# Patient Record
Sex: Female | Born: 1990 | Race: Black or African American | Hispanic: No | Marital: Single | Smoking: Current every day smoker
Health system: Southern US, Community
[De-identification: ages and names within clinical notes are randomized; demographics above are authoritative.]

## PROBLEM LIST (undated history)

## (undated) DIAGNOSIS — E282 Polycystic ovarian syndrome: Secondary | ICD-10-CM

## (undated) DIAGNOSIS — I10 Essential (primary) hypertension: Secondary | ICD-10-CM

## (undated) DIAGNOSIS — F319 Bipolar disorder, unspecified: Secondary | ICD-10-CM

## (undated) DIAGNOSIS — E119 Type 2 diabetes mellitus without complications: Secondary | ICD-10-CM

## (undated) HISTORY — DX: Bipolar disorder, unspecified: F31.9

## (undated) HISTORY — DX: Polycystic ovarian syndrome: E28.2

## (undated) HISTORY — PX: OOPHORECTOMY: SHX86

---
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
## 2009-08-23 ENCOUNTER — Emergency Department (HOSPITAL_COMMUNITY): Admission: EM | Admit: 2009-08-23 | Discharge: 2009-08-23 | Payer: Self-pay | Admitting: Emergency Medicine

## 2011-04-25 LAB — I-STAT 8, (EC8 V) (CONVERTED LAB)
BUN: 8
Chloride: 110
Glucose, Bld: 93
HCT: 46
Operator id: 288831
pCO2, Ven: 36.6 — ABNORMAL LOW
pH, Ven: 7.411 — ABNORMAL HIGH

## 2011-04-25 LAB — RAPID URINE DRUG SCREEN, HOSP PERFORMED
Amphetamines: NOT DETECTED
Barbiturates: NOT DETECTED
Benzodiazepines: NOT DETECTED
Cocaine: NOT DETECTED
Opiates: NOT DETECTED
Tetrahydrocannabinol: NOT DETECTED

## 2011-04-25 LAB — URINALYSIS, ROUTINE W REFLEX MICROSCOPIC
Bilirubin Urine: NEGATIVE
Glucose, UA: NEGATIVE
Hgb urine dipstick: NEGATIVE
Ketones, ur: NEGATIVE
Protein, ur: NEGATIVE
pH: 7

## 2011-04-25 LAB — ACETAMINOPHEN LEVEL: Acetaminophen (Tylenol), Serum: <10 — ABNORMAL LOW

## 2011-04-25 LAB — SALICYLATE LEVEL: Salicylate Lvl: <4

## 2011-04-25 LAB — ETHANOL: Alcohol, Ethyl (B): 176 — ABNORMAL HIGH

## 2012-09-21 ENCOUNTER — Other Ambulatory Visit (HOSPITAL_COMMUNITY)
Admission: RE | Admit: 2012-09-21 | Discharge: 2012-09-21 | Disposition: A | Discharge status: Home / Self Care | Payer: Self-pay | Source: Ambulatory Visit | Attending: Unknown Physician Specialty | Admitting: Unknown Physician Specialty

## 2012-09-21 DIAGNOSIS — R87619 Unspecified abnormal cytological findings in specimens from cervix uteri: Secondary | ICD-10-CM | POA: Insufficient documentation

## 2012-09-21 DIAGNOSIS — R87612 Low grade squamous intraepithelial lesion on cytologic smear of cervix (LGSIL): Secondary | ICD-10-CM | POA: Insufficient documentation

## 2013-07-28 ENCOUNTER — Encounter (HOSPITAL_COMMUNITY): Payer: Self-pay | Admitting: Emergency Medicine

## 2013-07-28 ENCOUNTER — Emergency Department (HOSPITAL_COMMUNITY)
Admission: EM | Admit: 2013-07-28 | Discharge: 2013-07-28 | Disposition: A | Discharge status: Home / Self Care | Payer: Self-pay | Attending: Emergency Medicine | Admitting: Emergency Medicine

## 2013-07-28 DIAGNOSIS — E119 Type 2 diabetes mellitus without complications: Secondary | ICD-10-CM | POA: Insufficient documentation

## 2013-07-28 DIAGNOSIS — Z79899 Other long term (current) drug therapy: Secondary | ICD-10-CM | POA: Insufficient documentation

## 2013-07-28 DIAGNOSIS — I10 Essential (primary) hypertension: Secondary | ICD-10-CM | POA: Insufficient documentation

## 2013-07-28 DIAGNOSIS — J029 Acute pharyngitis, unspecified: Secondary | ICD-10-CM | POA: Insufficient documentation

## 2013-07-28 DIAGNOSIS — Z87891 Personal history of nicotine dependence: Secondary | ICD-10-CM | POA: Insufficient documentation

## 2013-07-28 HISTORY — DX: Essential (primary) hypertension: I10

## 2013-07-28 HISTORY — DX: Type 2 diabetes mellitus without complications: E11.9

## 2013-07-28 LAB — GLUCOSE, CAPILLARY: GLUCOSE-CAPILLARY: 118 mg/dL — AB (ref 70–99)

## 2013-07-28 MED ORDER — HYDROCODONE-ACETAMINOPHEN 7.5-325 MG/15ML PO SOLN: 15.0000 mL | Freq: Three times a day (TID) | ORAL | Status: DC | PRN

## 2013-07-28 MED ORDER — LISINOPRIL 10 MG PO TABS
20.0000 mg | ORAL_TABLET | Freq: Once | ORAL | Status: AC
  Administered 2013-07-28: 20 mg via ORAL
  Filled 2013-07-28: qty 2

## 2013-07-28 MED ORDER — LISINOPRIL 20 MG PO TABS
20.0000 mg | ORAL_TABLET | Freq: Every day | ORAL | Status: DC
Start: 2013-07-28 — End: 2014-11-07

## 2013-07-28 NOTE — ED Notes (Signed)
Pt awakened with sore throat today, throat is red

## 2013-07-28 NOTE — ED Notes (Signed)
Vital signs stable. 

## 2013-07-28 NOTE — ED Provider Notes (Signed)
Medical screening examination/treatment/procedure(s) were performed by non-physician practitioner and as supervising physician I was immediately available for consultation/collaboration.  EKG Interpretation   None         Joya Gaskinsonald W Talin Feister, MD 07/28/13 1355

## 2013-07-28 NOTE — Discharge Instructions (Signed)
Recommend saltwater gargles and ibuprofen as needed for discomfort. You may use ice for severe sore throat. Resume taking your blood pressure medication as prescribed. Follow up with a primary care doctor. Return if symptoms worsen.  Viral Pharyngitis Viral pharyngitis is a viral infection that produces redness, pain, and swelling (inflammation) of the throat. It can spread from person to person (contagious). CAUSES Viral pharyngitis is caused by inhaling a large amount of certain germs called viruses. Many different viruses cause viral pharyngitis. SYMPTOMS Symptoms of viral pharyngitis include:  Sore throat.  Tiredness.  Stuffy nose.  Low-grade fever.  Congestion.  Cough. TREATMENT Treatment includes rest, drinking plenty of fluids, and the use of over-the-counter medication (approved by your caregiver). HOME CARE INSTRUCTIONS   Drink enough fluids to keep your urine clear or pale yellow.  Eat soft, cold foods such as ice cream, frozen ice pops, or gelatin dessert.  Gargle with warm salt water (1 tsp salt per 1 qt of water).  If over age 677, throat lozenges may be used safely.  Only take over-the-counter or prescription medicines for pain, discomfort, or fever as directed by your caregiver. Do not take aspirin. To help prevent spreading viral pharyngitis to others, avoid:  Mouth-to-mouth contact with others.  Sharing utensils for eating and drinking.  Coughing around others. SEEK MEDICAL CARE IF:   You are better in a few days, then become worse.  You have a fever or pain not helped by pain medicines.  There are any other changes that concern you. Document Released: 04/09/2005 Document Revised: 09/22/2011 Document Reviewed: 09/05/2010 [facility] Patient Information 2014 [place], [place]. Salt Water Gargle This solution will help make your mouth and throat feel better. HOME CARE INSTRUCTIONS   Mix 1 teaspoon of salt in 8 ounces of warm water.  Gargle with this  solution as much or often as you need or as directed. Swish and gargle gently if you have any sores or wounds in your mouth.  Do not swallow this mixture. Document Released: 04/03/2004 Document Revised: 09/22/2011 Document Reviewed: 08/25/2008 [facility] ChildrenExit[facility] Patient Information 2014 [place], [place].

## 2013-07-28 NOTE — ED Notes (Signed)
Sore throat today.

## 2013-07-28 NOTE — ED Provider Notes (Signed)
CSN: [id number]     Arrival date & time 07/28/13  1028 History   First MD Initiated Contact with Patient 07/28/13 1036     Chief Complaint  Patient presents with  . Sore Throat   (Consider location/radiation/quality/duration/timing/severity/associated sxs/prior Treatment) Patient is a [age] y.o. female presenting with pharyngitis. The history is provided by the patient. No language interpreter was used.  Sore Throat This is a new problem. The current episode started today. The problem occurs constantly. The problem has been unchanged. Associated symptoms include a sore throat. Pertinent negatives include no congestion, coughing, diaphoresis, fatigue, fever, myalgias, neck pain, rash, vomiting or weakness. The symptoms are aggravated by swallowing. She has tried nothing for the symptoms. Improvement on treatment: nothing tried.    Past Medical History  Diagnosis Date  . Hypertension   . Diabetes mellitus without complication    Past Surgical History  Procedure Laterality Date  . Oophorectomy     No family history on file. History  Substance Use Topics  . Smoking status: Former Games developermoker  . Smokeless tobacco: Not on file  . Alcohol Use: Yes   OB History   Grav Para Term Preterm Abortions TAB SAB Ect Mult Living                 Review of Systems  Constitutional: Negative for fever, diaphoresis and fatigue.  HENT: Positive for sore throat. Negative for congestion, drooling, sinus pressure and trouble swallowing.   Respiratory: Negative for cough.   Gastrointestinal: Negative for vomiting.  Musculoskeletal: Negative for myalgias and neck pain.  Skin: Negative for rash.  Neurological: Negative for weakness.  All other systems reviewed and are negative.    Allergies  Review of patient's allergies indicates no known allergies.  Home Medications   Current Outpatient Rx  Name  Route  Sig  Dispense  Refill  . ibuprofen (ADVIL,MOTRIN) 200 MG tablet   Oral   Take 800 mg by mouth  every 6 (six) hours as needed for fever or moderate pain.         . metFORMIN (GLUCOPHAGE) 500 MG tablet   Oral   Take 1,000 mg by mouth 2 (two) times daily with a meal.         . HYDROcodone-acetaminophen (HYCET) 7.5-325 mg/15 ml solution   Oral   Take 15 mLs by mouth every 8 (eight) hours as needed for moderate pain.   120 mL   0   . lisinopril (PRINIVIL,ZESTRIL) 20 MG tablet   Oral   Take 1 tablet (20 mg total) by mouth daily.   30 tablet   0    BP 156/107  Pulse 77  Temp(Src) 97.7 F (36.5 C)  Resp 16  Ht 5\' 7"  (1.702 m)  Wt 248 lb (112.492 kg)  BMI 38.83 kg/m2  SpO2 100%  LMP 07/03/2013 Physical Exam  Nursing note and vitals reviewed. Constitutional: She is oriented to person, place, and time. She appears well-developed and well-nourished. No distress.  HENT:  Head: Normocephalic and atraumatic.  Right Ear: Tympanic membrane, external ear and ear canal normal. No mastoid tenderness.  Left Ear: Tympanic membrane, external ear and ear canal normal. No mastoid tenderness.  Nose: Nose normal.  Mouth/Throat: Uvula is midline and mucous membranes are normal. Posterior oropharyngeal erythema present. No oropharyngeal exudate, posterior oropharyngeal edema or tonsillar abscesses.  Uvula midline and patient tolerating secretions without difficulty. Airway patent. No trismus or stridor. No exudates appreciated.  Eyes: Conjunctivae and EOM are normal. Pupils are  equal, round, and reactive to light. No scleral icterus.  Neck: Normal range of motion. Neck supple.  Cardiovascular: Normal rate, regular rhythm and normal heart sounds.   Pulmonary/Chest: Effort normal and breath sounds normal. No stridor. No respiratory distress. She has no wheezes. She has no rales.  Musculoskeletal: Normal range of motion.  Lymphadenopathy:    She has cervical adenopathy (Anterior cervical bilaterally; nontender).  Neurological: She is alert and oriented to person, place, and time.  Skin:  Skin is warm and dry. No rash noted. She is not diaphoretic. No erythema. No pallor.  Psychiatric: She has a normal mood and affect. Her behavior is normal.    ED Course  Procedures (including critical care time) Labs Review Labs Reviewed  GLUCOSE, CAPILLARY - Abnormal; Notable for the following:    Glucose-Capillary 118 (*)    All other components within normal limits   Imaging Review No results found.  EKG Interpretation   None       MDM   1. Pharyngitis    Uncomplicated pharyngitis. Patient is well and nontoxic appearing, hemodynamically stable, and afebrile. Hypertension improved over ED course and patient denies any headache or chest pain. Blood pressure medication administered. Uvula midline without evidence of peritonsillar abscess. No exudates appreciated; low likelihood of strep given physical exam, supported by Centor Score of 2. Airway patent and patient tolerating secretions without difficulty. No stridor. Patient is stable for discharge with prescription for Hycet for symptoms. PCP follow up advised.  Return precautions provided and patient agreeable to plan with no unaddressed concerns.    Antony Madura, PA-C 07/28/13 1132

## 2014-11-07 ENCOUNTER — Encounter (HOSPITAL_COMMUNITY): Payer: Self-pay | Admitting: Emergency Medicine

## 2014-11-07 ENCOUNTER — Emergency Department (HOSPITAL_COMMUNITY)
Admission: EM | Admit: 2014-11-07 | Discharge: 2014-11-07 | Disposition: A | Discharge status: Home / Self Care | Payer: Self-pay | Attending: Emergency Medicine | Admitting: Emergency Medicine

## 2014-11-07 ENCOUNTER — Emergency Department (HOSPITAL_COMMUNITY): Payer: Self-pay

## 2014-11-07 DIAGNOSIS — E119 Type 2 diabetes mellitus without complications: Secondary | ICD-10-CM | POA: Insufficient documentation

## 2014-11-07 DIAGNOSIS — I10 Essential (primary) hypertension: Secondary | ICD-10-CM | POA: Insufficient documentation

## 2014-11-07 DIAGNOSIS — J209 Acute bronchitis, unspecified: Secondary | ICD-10-CM

## 2014-11-07 DIAGNOSIS — R05 Cough: Secondary | ICD-10-CM

## 2014-11-07 DIAGNOSIS — J40 Bronchitis, not specified as acute or chronic: Secondary | ICD-10-CM | POA: Insufficient documentation

## 2014-11-07 DIAGNOSIS — Z79899 Other long term (current) drug therapy: Secondary | ICD-10-CM | POA: Insufficient documentation

## 2014-11-07 DIAGNOSIS — Z87891 Personal history of nicotine dependence: Secondary | ICD-10-CM | POA: Insufficient documentation

## 2014-11-07 DIAGNOSIS — R059 Cough, unspecified: Secondary | ICD-10-CM

## 2014-11-07 LAB — CBG MONITORING, ED: GLUCOSE-CAPILLARY: 325 mg/dL — AB (ref 70–99)

## 2014-11-07 MED ORDER — LISINOPRIL 10 MG PO TABS
20.0000 mg | ORAL_TABLET | Freq: Once | ORAL | Status: AC
  Administered 2014-11-07: 20 mg via ORAL
  Filled 2014-11-07: qty 2

## 2014-11-07 MED ORDER — LISINOPRIL 20 MG PO TABS: 20.0000 mg | ORAL_TABLET | Freq: Every day | ORAL | Status: DC

## 2014-11-07 MED ORDER — IPRATROPIUM-ALBUTEROL 0.5-2.5 (3) MG/3ML IN SOLN
3.0000 mL | Freq: Once | RESPIRATORY_TRACT | Status: AC
  Administered 2014-11-07: 3 mL via RESPIRATORY_TRACT
  Filled 2014-11-07: qty 3

## 2014-11-07 MED ORDER — HYDROCHLOROTHIAZIDE 12.5 MG PO TABS: 25.0000 mg | ORAL_TABLET | Freq: Every day | ORAL | Status: DC

## 2014-11-07 MED ORDER — HYDROCHLOROTHIAZIDE 25 MG PO TABS
25.0000 mg | ORAL_TABLET | Freq: Every day | ORAL | Status: DC
  Administered 2014-11-07: 25 mg via ORAL

## 2014-11-07 MED ORDER — ALBUTEROL SULFATE HFA 108 (90 BASE) MCG/ACT IN AERS
2.0000 | INHALATION_SPRAY | RESPIRATORY_TRACT | Status: DC | PRN
  Administered 2014-11-07: 2 via RESPIRATORY_TRACT
  Filled 2014-11-07: qty 6.7

## 2014-11-07 NOTE — Discharge Instructions (Signed)
Acute Bronchitis Bronchitis is inflammation of the airways that extend from the windpipe into the lungs (bronchi). The inflammation often causes mucus to develop. This leads to a cough, which is the most common symptom of bronchitis.  In acute bronchitis, the condition usually develops suddenly and goes away over time, usually in a couple weeks. Smoking, allergies, and asthma can make bronchitis worse. Repeated episodes of bronchitis may cause further lung problems.  CAUSES Acute bronchitis is most often caused by the same virus that causes a cold. The virus can spread from person to person (contagious) through coughing, sneezing, and touching contaminated objects. SIGNS AND SYMPTOMS   Cough.   Fever.   Coughing up mucus.   Body aches.   Chest congestion.   Chills.   Shortness of breath.   Sore throat.  DIAGNOSIS  Acute bronchitis is usually diagnosed through a physical exam. Your health care provider will also ask you questions about your medical history. Tests, such as chest X-rays, are sometimes done to rule out other conditions.  TREATMENT  Acute bronchitis usually goes away in a couple weeks. Oftentimes, no medical treatment is necessary. Medicines are sometimes given for relief of fever or cough. Antibiotic medicines are usually not needed but may be prescribed in certain situations. In some cases, an inhaler may be recommended to help reduce shortness of breath and control the cough. A cool mist vaporizer may also be used to help thin bronchial secretions and make it easier to clear the chest.  HOME CARE INSTRUCTIONS  Get plenty of rest.   Drink enough fluids to keep your urine clear or pale yellow (unless you have a medical condition that requires fluid restriction). Increasing fluids may help thin your respiratory secretions (sputum) and reduce chest congestion, and it will prevent dehydration.   Take medicines only as directed by your health care provider.  If  you were prescribed an antibiotic medicine, finish it all even if you start to feel better.  Avoid smoking and secondhand smoke. Exposure to cigarette smoke or irritating chemicals will make bronchitis worse. If you are a smoker, consider using nicotine gum or skin patches to help control withdrawal symptoms. Quitting smoking will help your lungs heal faster.   Reduce the chances of another bout of acute bronchitis by washing your hands frequently, avoiding people with cold symptoms, and trying not to touch your hands to your mouth, nose, or eyes.   Keep all follow-up visits as directed by your health care provider.  SEEK MEDICAL CARE IF: Your symptoms do not improve after 1 week of treatment.  SEEK IMMEDIATE MEDICAL CARE IF:  You develop an increased fever or chills.   You have chest pain.   You have severe shortness of breath.  You have bloody sputum.   You develop dehydration.  You faint or repeatedly feel like you are going to pass out.  You develop repeated vomiting.  You develop a severe headache. MAKE SURE YOU:   Understand these instructions.  Will watch your condition.  Will get help right away if you are not doing well or get worse. Document Released: 08/07/2004 Document Revised: 11/14/2013 Document Reviewed: 12/21/2012 [facility] Patient Information 2015 [place], [place]. This information is not intended to replace advice given to you by your health care provider. Make sure you discuss any questions you have with your health care provider.  Hypertension Hypertension, commonly called high blood pressure, is when the force of blood pumping through your arteries is too strong. Your arteries are  the blood vessels that carry blood from your heart throughout your body. A blood pressure reading consists of a higher number over a lower number, such as 110/72. The higher number (systolic) is the pressure inside your arteries when your heart pumps. The lower number  (diastolic) is the pressure inside your arteries when your heart relaxes. Ideally you want your blood pressure below 120/80. Hypertension forces your heart to work harder to pump blood. Your arteries may become narrow or stiff. Having hypertension puts you at risk for heart disease, stroke, and other problems.  RISK FACTORS Some risk factors for high blood pressure are controllable. Others are not.  Risk factors you cannot control include:   Race. You may be at higher risk if you are African American.  Age. Risk increases with age.  Gender. Men are at higher risk than women before age 19 years. After age 38, women are at higher risk than men. Risk factors you can control include:  Not getting enough exercise or physical activity.  Being overweight.  Getting too much fat, sugar, calories, or salt in your diet.  Drinking too much alcohol. SIGNS AND SYMPTOMS Hypertension does not usually cause signs or symptoms. Extremely high blood pressure (hypertensive crisis) may cause headache, anxiety, shortness of breath, and nosebleed. DIAGNOSIS  To check if you have hypertension, your health care provider will measure your blood pressure while you are seated, with your arm held at the level of your heart. It should be measured at least twice using the same arm. Certain conditions can cause a difference in blood pressure between your right and left arms. A blood pressure reading that is higher than normal on one occasion does not mean that you need treatment. If one blood pressure reading is high, ask your health care provider about having it checked again. TREATMENT  Treating high blood pressure includes making lifestyle changes and possibly taking medicine. Living a healthy lifestyle can help lower high blood pressure. You may need to change some of your habits. Lifestyle changes may include:  Following the DASH diet. This diet is high in fruits, vegetables, and whole grains. It is low in salt, red  meat, and added sugars.  Getting at least 2 hours of brisk physical activity every week.  Losing weight if necessary.  Not smoking.  Limiting alcoholic beverages.  Learning ways to reduce stress. If lifestyle changes are not enough to get your blood pressure under control, your health care provider may prescribe medicine. You may need to take more than one. Work closely with your health care provider to understand the risks and benefits. HOME CARE INSTRUCTIONS  Have your blood pressure rechecked as directed by your health care provider.   Take medicines only as directed by your health care provider. Follow the directions carefully. Blood pressure medicines must be taken as prescribed. The medicine does not work as well when you skip doses. Skipping doses also puts you at risk for problems.   Do not smoke.   Monitor your blood pressure at home as directed by your health care provider. SEEK MEDICAL CARE IF:   You think you are having a reaction to medicines taken.  You have recurrent headaches or feel dizzy.  You have swelling in your ankles.  You have trouble with your vision. SEEK IMMEDIATE MEDICAL CARE IF:  You develop a severe headache or confusion.  You have unusual weakness, numbness, or feel faint.  You have severe chest or abdominal pain.  You vomit repeatedly.  You have trouble breathing. MAKE SURE YOU:   Understand these instructions.  Will watch your condition.  Will get help right away if you are not doing well or get worse. Document Released: 06/30/2005 Document Revised: 11/14/2013 Document Reviewed: 04/22/2013 [facility] Patient Information 2015 [place], [place]. This information is not intended to replace advice given to you by your health care provider. Make sure you discuss any questions you have with your health care provider.  Albuterol inhalation aerosol What is this medicine? ALBUTEROL (al Gaspar Bidding) is a bronchodilator. It helps open up  the airways in your lungs to make it easier to breathe. This medicine is used to treat and to prevent bronchospasm. This medicine may be used for other purposes; ask your health care provider or pharmacist if you have questions. COMMON BRAND NAME(S): Proair HFA, Proventil, Proventil HFA, Respirol, Ventolin, Ventolin HFA What should I tell my health care provider before I take this medicine? They need to know if you have any of the following conditions: -diabetes -heart disease or irregular heartbeat -high blood pressure -pheochromocytoma -seizures -thyroid disease -an unusual or allergic reaction to albuterol, levalbuterol, sulfites, other medicines, foods, dyes, or preservatives -pregnant or trying to get pregnant -breast-feeding How should I use this medicine? This medicine is for inhalation through the mouth. Follow the directions on your prescription label. Take your medicine at regular intervals. Do not use more often than directed. Make sure that you are using your inhaler correctly. Ask you doctor or health care provider if you have any questions. Talk to your pediatrician regarding the use of this medicine in children. Special care may be needed. Overdosage: If you think you have taken too much of this medicine contact a poison control center or emergency room at once. NOTE: This medicine is only for you. Do not share this medicine with others. What if I miss a dose? If you miss a dose, use it as soon as you can. If it is almost time for your next dose, use only that dose. Do not use double or extra doses. What may interact with this medicine? -anti-infectives like chloroquine and pentamidine -caffeine -cisapride -diuretics -medicines for colds -medicines for depression or for emotional or psychotic conditions -medicines for weight loss including some herbal products -methadone -some antibiotics like clarithromycin, erythromycin, levofloxacin, and linezolid -some heart  medicines -steroid hormones like dexamethasone, cortisone, hydrocortisone -theophylline -thyroid hormones This list may not describe all possible interactions. Give your health care provider a list of all the medicines, herbs, non-prescription drugs, or dietary supplements you use. Also tell them if you smoke, drink alcohol, or use illegal drugs. Some items may interact with your medicine. What should I watch for while using this medicine? Tell your doctor or health care professional if your symptoms do not improve. Do not use extra albuterol. If your asthma or bronchitis gets worse while you are using this medicine, call your doctor right away. If your mouth gets dry try chewing sugarless gum or sucking hard candy. Drink water as directed. What side effects may I notice from receiving this medicine? Side effects that you should report to your doctor or health care professional as soon as possible: -allergic reactions like skin rash, itching or hives, swelling of the face, lips, or tongue -breathing problems -chest pain -feeling faint or lightheaded, falls -high blood pressure -irregular heartbeat -fever -muscle cramps or weakness -pain, tingling, numbness in the hands or feet -vomiting Side effects that usually do not require medical attention (report to  your doctor or health care professional if they continue or are bothersome): -cough -difficulty sleeping -headache -nervousness or trembling -stomach upset -stuffy or runny nose -throat irritation -unusual taste This list may not describe all possible side effects. Call your doctor for medical advice about side effects. You may report side effects to FDA at 1-800-FDA-1088. Where should I keep my medicine? Keep out of the reach of children. Store at room temperature between 15 and 30 degrees C (59 and 86 degrees F). The contents are under pressure and may burst when exposed to heat or flame. Do not freeze. This medicine does not work  as well if it is too cold. Throw away any unused medicine after the expiration date. Inhalers need to be thrown away after the labeled number of puffs have been used or by the expiration date; whichever comes first. Ventolin HFA should be thrown away 12 months after removing from foil pouch. Check the instructions that come with your medicine. NOTE: This sheet is a summary. It may not cover all possible information. If you have questions about this medicine, talk to your doctor, pharmacist, or health care provider.  2015, [employer] Standard. (2012-12-16 10:57:17)  Lisinopril tablets What is this medicine? LISINOPRIL (lyse IN oh pril) is an ACE inhibitor. This medicine is used to treat high blood pressure and heart failure. It is also used to protect the heart immediately after a heart attack. This medicine may be used for other purposes; ask your health care provider or pharmacist if you have questions. COMMON BRAND NAME(S): Prinivil, Zestril What should I tell my health care provider before I take this medicine? They need to know if you have any of these conditions: -diabetes -heart or blood vessel disease -immune system disease like lupus or scleroderma -kidney disease -low blood pressure -previous swelling of the tongue, face, or lips with difficulty breathing, difficulty swallowing, hoarseness, or tightening of the throat -an unusual or allergic reaction to lisinopril, other ACE inhibitors, insect venom, foods, dyes, or preservatives -pregnant or trying to get pregnant -breast-feeding How should I use this medicine? Take this medicine by mouth with a glass of water. Follow the directions on your prescription label. You may take this medicine with or without food. Take your medicine at regular intervals. Do not stop taking this medicine except on the advice of your doctor or health care professional. Talk to your pediatrician regarding the use of this medicine in children. Special care  may be needed. While this drug may be prescribed for children as young as 22 years of age for selected conditions, precautions do apply. Overdosage: If you think you have taken too much of this medicine contact a poison control center or emergency room at once. NOTE: This medicine is only for you. Do not share this medicine with others. What if I miss a dose? If you miss a dose, take it as soon as you can. If it is almost time for your next dose, take only that dose. Do not take double or extra doses. What may interact with this medicine? -diuretics -lithium -NSAIDs, medicines for pain and inflammation, like ibuprofen or naproxen -over-the-counter herbal supplements like hawthorn -potassium salts or potassium supplements -salt substitutes This list may not describe all possible interactions. Give your health care provider a list of all the medicines, herbs, non-prescription drugs, or dietary supplements you use. Also tell them if you smoke, drink alcohol, or use illegal drugs. Some items may interact with your medicine. What should I watch for  while using this medicine? Visit your doctor or health care professional for regular check ups. Check your blood pressure as directed. Ask your doctor what your blood pressure should be, and when you should contact him or her. Call your doctor or health care professional if you notice an irregular or fast heart beat. Women should inform their doctor if they wish to become pregnant or think they might be pregnant. There is a potential for serious side effects to an unborn child. Talk to your health care professional or pharmacist for more information. Check with your doctor or health care professional if you get an attack of severe diarrhea, nausea and vomiting, or if you sweat a lot. The loss of too much body fluid can make it dangerous for you to take this medicine. You may get drowsy or dizzy. Do not drive, use machinery, or do anything that needs mental  alertness until you know how this drug affects you. Do not stand or sit up quickly, especially if you are an older patient. This reduces the risk of dizzy or fainting spells. Alcohol can make you more drowsy and dizzy. Avoid alcoholic drinks. Avoid salt substitutes unless you are told otherwise by your doctor or health care professional. Do not treat yourself for coughs, colds, or pain while you are taking this medicine without asking your doctor or health care professional for advice. Some ingredients may increase your blood pressure. What side effects may I notice from receiving this medicine? Side effects that you should report to your doctor or health care professional as soon as possible: -abdominal pain with or without nausea or vomiting -allergic reactions like skin rash or hives, swelling of the hands, feet, face, lips, throat, or tongue -dark urine -difficulty breathing -dizzy, lightheaded or fainting spell -fever or sore throat -irregular heart beat, chest pain -pain or difficulty passing urine -redness, blistering, peeling or loosening of the skin, including inside the mouth -unusually weak -yellowing of the eyes or skin Side effects that usually do not require medical attention (report to your doctor or health care professional if they continue or are bothersome): -change in taste -cough -decreased sexual function or desire -headache -sun sensitivity -tiredness This list may not describe all possible side effects. Call your doctor for medical advice about side effects. You may report side effects to FDA at 1-800-FDA-1088. Where should I keep my medicine? Keep out of the reach of children. Store at room temperature between 15 and 30 degrees C (59 and 86 degrees F). Protect from moisture. Keep container tightly closed. Throw away any unused medicine after the expiration date. NOTE: This sheet is a summary. It may not cover all possible information. If you have questions about  this medicine, talk to your doctor, pharmacist, or health care provider.  2015, [employer] Standard. (2008-01-03 17:36:32)  Hydrochlorothiazide, HCTZ capsules or tablets What is this medicine? HYDROCHLOROTHIAZIDE (hye droe klor oh THYE a zide) is a diuretic. It increases the amount of urine passed, which causes the body to lose salt and water. This medicine is used to treat high blood pressure. It is also reduces the swelling and water retention caused by various medical conditions, such as heart, liver, or kidney disease. This medicine may be used for other purposes; ask your health care provider or pharmacist if you have questions. COMMON BRAND NAME(S): Esidrix, Ezide, HydroDIURIL, Microzide, Oretic, Zide What should I tell my health care provider before I take this medicine? They need to know if you have any of these  conditions: -diabetes -gout -immune system problems, like lupus -kidney disease or kidney stones -liver disease -pancreatitis -small amount of urine or difficulty passing urine -an unusual or allergic reaction to hydrochlorothiazide, sulfa drugs, other medicines, foods, dyes, or preservatives -pregnant or trying to get pregnant -breast-feeding How should I use this medicine? Take this medicine by mouth with a glass of water. Follow the directions on the prescription label. Take your medicine at regular intervals. Remember that you will need to pass urine frequently after taking this medicine. Do not take your doses at a time of day that will cause you problems. Do not stop taking your medicine unless your doctor tells you to. Talk to your pediatrician regarding the use of this medicine in children. Special care may be needed. Overdosage: If you think you have taken too much of this medicine contact a poison control center or emergency room at once. NOTE: This medicine is only for you. Do not share this medicine with others. What if I miss a dose? If you miss a dose, take  it as soon as you can. If it is almost time for your next dose, take only that dose. Do not take double or extra doses. What may interact with this medicine? -cholestyramine -colestipol -digoxin -dofetilide -lithium -medicines for blood pressure -medicines for diabetes -medicines that relax muscles for surgery -other diuretics -steroid medicines like prednisone or cortisone This list may not describe all possible interactions. Give your health care provider a list of all the medicines, herbs, non-prescription drugs, or dietary supplements you use. Also tell them if you smoke, drink alcohol, or use illegal drugs. Some items may interact with your medicine. What should I watch for while using this medicine? Visit your doctor or health care professional for regular checks on your progress. Check your blood pressure as directed. Ask your doctor or health care professional what your blood pressure should be and when you should contact him or her. You may need to be on a special diet while taking this medicine. Ask your doctor. Check with your doctor or health care professional if you get an attack of severe diarrhea, nausea and vomiting, or if you sweat a lot. The loss of too much body fluid can make it dangerous for you to take this medicine. You may get drowsy or dizzy. Do not drive, use machinery, or do anything that needs mental alertness until you know how this medicine affects you. Do not stand or sit up quickly, especially if you are an older patient. This reduces the risk of dizzy or fainting spells. Alcohol may interfere with the effect of this medicine. Avoid alcoholic drinks. This medicine may affect your blood sugar level. If you have diabetes, check with your doctor or health care professional before changing the dose of your diabetic medicine. This medicine can make you more sensitive to the sun. Keep out of the sun. If you cannot avoid being in the sun, wear protective clothing and use  sunscreen. Do not use sun lamps or tanning beds/booths. What side effects may I notice from receiving this medicine? Side effects that you should report to your doctor or health care professional as soon as possible: -allergic reactions such as skin rash or itching, hives, swelling of the lips, mouth, tongue, or throat -changes in vision -chest pain -eye pain -fast or irregular heartbeat -feeling faint or lightheaded, falls -gout attack -muscle pain or cramps -pain or difficulty when passing urine -pain, tingling, numbness in the hands or feet -redness,  blistering, peeling or loosening of the skin, including inside the mouth -unusually weak or tired Side effects that usually do not require medical attention (report to your doctor or health care professional if they continue or are bothersome): -change in sex drive or performance -dry mouth -headache -stomach upset This list may not describe all possible side effects. Call your doctor for medical advice about side effects. You may report side effects to FDA at 1-800-FDA-1088. Where should I keep my medicine? Keep out of the reach of children. Store at room temperature between 15 and 30 degrees C (59 and 86 degrees F). Do not freeze. Protect from light and moisture. Keep container closed tightly. Throw away any unused medicine after the expiration date. NOTE: This sheet is a summary. It may not cover all possible information. If you have questions about this medicine, talk to your doctor, pharmacist, or health care provider.  2015, [employer] Standard. (2010-02-22 12:57:37)

## 2014-11-07 NOTE — ED Notes (Signed)
Pt started new job Friday and went in today and was having sob and wheezing.

## 2014-11-07 NOTE — ED Provider Notes (Signed)
CSN: [id number]     Arrival date & time 11/07/14  0101 History   First MD Initiated Contact with Patient 11/07/14 0340     Chief Complaint  Patient presents with  . Shortness of Breath     (Consider location/radiation/quality/duration/timing/severity/associated sxs/prior Treatment) Patient is a 24 y.o. female presenting with shortness of breath. The history is provided by the patient.  Shortness of Breath She started new job about 3 days ago and noted onset morning of April 25 coughing and wheezing and dyspnea. Cough is productive of clear sputum with occasional streaks of blood she denies fever, chills, sweats or denies chest pain. There is no nausea. She denies arthralgias or myalgias. Of note, she does have history of diabetes and hypertension but has not been taking her antihypertensive medications. She states she knows when her blood pressure is high because she gets a headache. She had been on lisinopril 20 mg and HCTZ 25 mg. She states she has been taking her metformin for her diabetes but does not monitor her blood sugars.  Past Medical History  Diagnosis Date  . Hypertension   . Diabetes mellitus without complication    Past Surgical History  Procedure Laterality Date  . Oophorectomy     History reviewed. No pertinent family history. History  Substance Use Topics  . Smoking status: Former Games developermoker  . Smokeless tobacco: Not on file  . Alcohol Use: Yes   OB History    No data available     Review of Systems  Respiratory: Positive for shortness of breath.   All other systems reviewed and are negative.     Allergies  Review of patient's allergies indicates no known allergies.  Home Medications   Prior to Admission medications   Medication Sig Start Date End Date Taking? Authorizing Provider  HYDROcodone-acetaminophen (HYCET) 7.5-325 mg/15 ml solution Take 15 mLs by mouth every 8 (eight) hours as needed for moderate pain. 07/28/13   Antony MaduraKelly Humes, PA-C  ibuprofen  (ADVIL,MOTRIN) 200 MG tablet Take 800 mg by mouth every 6 (six) hours as needed for fever or moderate pain.    Historical Provider, MD  lisinopril (PRINIVIL,ZESTRIL) 20 MG tablet Take 1 tablet (20 mg total) by mouth daily. 07/28/13   Antony MaduraKelly Humes, PA-C  metFORMIN (GLUCOPHAGE) 500 MG tablet Take 1,000 mg by mouth 2 (two) times daily with a meal.    Historical Provider, MD   BP 163/117 mmHg  Pulse 110  Temp(Src) 98.5 F (36.9 C)  Resp 18  Ht 5\' 6"  (1.676 m)  Wt 240 lb (108.863 kg)  BMI 38.76 kg/m2  SpO2 100%  LMP 09/12/2014 Physical Exam  Nursing note and vitals reviewed.  24 year old female, resting comfortably and in no acute distress. Vital signs are significant for hypertension and tachycardia. Oxygen saturation is 100%, which is normal. Head is normocephalic and atraumatic. PERRLA, EOMI. Oropharynx is clear. Neck is nontender and supple without adenopathy or JVD. Back is nontender and there is no CVA tenderness. Lungshave markedly diminished air flow. However this does seem to be voluntary as patient states that she will cough if she takes a deep breath. I did not hear any rales, wheezes, or rhonchi. Chest is nontender. Heart has regular rate and rhythm without murmur. Abdomen is soft, flat, nontender without masses or hepatosplenomegaly and peristalsis is normoactive. Extremities have no cyanosis or edema, full range of motion is present. Skin is warm and dry without rash. Neurologic: Mental status is normal, cranial nerves are intact, there  are no motor or sensory deficits.  ED Course  Procedures (including critical care time) Labs Review Results for orders placed or performed during the hospital encounter of 11/07/14  POC CBG, ED  Result Value Ref Range   Glucose-Capillary 325 (H) 70 - 99 mg/dL   Imaging Review Dg Chest 2 View  11/07/2014   CLINICAL DATA:  Shortness of breath, cough and wheezing for 1 day.  EXAM: CHEST  2 VIEW  COMPARISON:  None.  FINDINGS: The  cardiomediastinal silhouette is unremarkable.  Mild peribronchial thickening identified.  There is no evidence of focal airspace disease, pulmonary edema, suspicious pulmonary nodule/mass, pleural effusion, or pneumothorax. No acute bony abnormalities are identified.  IMPRESSION: Mild peribronchial thickening without focal pneumonia. This is nonspecific and of uncertain chronicity but may be related to bronchitis or asthma.   Electronically Signed   By: Harmon Pier M.D.   On: 11/07/2014 04:38     MDM   Final diagnoses:  Cough  Acute bronchitis, unspecified organism  Essential hypertension  Type 2 diabetes mellitus without complication    Cough and wheezing. Chest x-ray will be obtained to rule out pneumonia. This could be related to environmental factors where she works. She will be given a therapeutic trial of a beautiful with ipratropium. Severe hypertension secondary to medication noncompliance. Old records are reviewed and blood pressure had been elevated at her last ED visit and she had been given a prescription for lisinopril at that time. She will be restarted on her lisinopril and hydrochlorothiazide. CBG will be checked.  Blood glucose is over 300, side do not feel she should get any steroids. She had significant improvement with albuterol with ipratropium and more improvement with a second treatment. Pressure has come down significantly with getting a dose of HCTZ and lisinopril. She is discharged with prescriptions for hydrochlorothiazide and lisinopril and she is given no beer on inhaler to take home. Follow-up with [facility] health Department.  Dione Booze, MD 11/07/14 [phone]

## 2015-05-11 ENCOUNTER — Encounter (HOSPITAL_COMMUNITY): Payer: Self-pay | Admitting: *Deleted

## 2015-05-11 ENCOUNTER — Emergency Department (HOSPITAL_COMMUNITY): Payer: Self-pay

## 2015-05-11 ENCOUNTER — Emergency Department (HOSPITAL_COMMUNITY)
Admission: EM | Admit: 2015-05-11 | Discharge: 2015-05-11 | Disposition: A | Discharge status: Home / Self Care | Payer: Self-pay | Attending: Emergency Medicine | Admitting: Emergency Medicine

## 2015-05-11 DIAGNOSIS — J40 Bronchitis, not specified as acute or chronic: Secondary | ICD-10-CM

## 2015-05-11 DIAGNOSIS — Z720 Tobacco use: Secondary | ICD-10-CM | POA: Insufficient documentation

## 2015-05-11 DIAGNOSIS — I10 Essential (primary) hypertension: Secondary | ICD-10-CM | POA: Insufficient documentation

## 2015-05-11 DIAGNOSIS — J4531 Mild persistent asthma with (acute) exacerbation: Secondary | ICD-10-CM | POA: Insufficient documentation

## 2015-05-11 DIAGNOSIS — Z79899 Other long term (current) drug therapy: Secondary | ICD-10-CM | POA: Insufficient documentation

## 2015-05-11 DIAGNOSIS — E119 Type 2 diabetes mellitus without complications: Secondary | ICD-10-CM | POA: Insufficient documentation

## 2015-05-11 LAB — CBC WITH DIFFERENTIAL/PLATELET
BASOS ABS: 0 10*3/uL (ref 0.0–0.1)
Basophils Relative: 0 %
EOS PCT: 2 %
Eosinophils Absolute: 0.5 10*3/uL (ref 0.0–0.7)
HCT: 36.4 % (ref 36.0–46.0)
Hemoglobin: 11.9 g/dL — ABNORMAL LOW (ref 12.0–15.0)
LYMPHS PCT: 6 %
Lymphs Abs: 1.2 10*3/uL (ref 0.7–4.0)
MCH: 26 pg (ref 26.0–34.0)
MCHC: 32.7 g/dL (ref 30.0–36.0)
MCV: 79.6 fL (ref 78.0–100.0)
MONO ABS: 0.7 10*3/uL (ref 0.1–1.0)
MONOS PCT: 3 %
Neutro Abs: 17.2 10*3/uL — ABNORMAL HIGH (ref 1.7–7.7)
Neutrophils Relative %: 89 %
PLATELETS: 388 10*3/uL (ref 150–400)
RBC: 4.57 MIL/uL (ref 3.87–5.11)
RDW: 15.6 % — AB (ref 11.5–15.5)
WBC: 19.5 10*3/uL — ABNORMAL HIGH (ref 4.0–10.5)

## 2015-05-11 LAB — URINALYSIS, ROUTINE W REFLEX MICROSCOPIC
Bilirubin Urine: NEGATIVE
Glucose, UA: NEGATIVE mg/dL
Hgb urine dipstick: NEGATIVE
KETONES UR: NEGATIVE mg/dL
LEUKOCYTES UA: NEGATIVE
Nitrite: NEGATIVE
PROTEIN: 100 mg/dL — AB
Specific Gravity, Urine: 1.02 (ref 1.005–1.030)
UROBILINOGEN UA: 0.2 mg/dL (ref 0.0–1.0)
pH: 6.5 (ref 5.0–8.0)

## 2015-05-11 LAB — COMPREHENSIVE METABOLIC PANEL
ALT: 13 U/L — ABNORMAL LOW (ref 14–54)
ANION GAP: 9 (ref 5–15)
AST: 16 U/L (ref 15–41)
Albumin: 3.6 g/dL (ref 3.5–5.0)
Alkaline Phosphatase: 80 U/L (ref 38–126)
BILIRUBIN TOTAL: 0.7 mg/dL (ref 0.3–1.2)
BUN: 9 mg/dL (ref 6–20)
CHLORIDE: 101 mmol/L (ref 101–111)
CO2: 26 mmol/L (ref 22–32)
Calcium: 9 mg/dL (ref 8.9–10.3)
Creatinine, Ser: 0.64 mg/dL (ref 0.44–1.00)
Glucose, Bld: 116 mg/dL — ABNORMAL HIGH (ref 65–99)
POTASSIUM: 3.9 mmol/L (ref 3.5–5.1)
Sodium: 136 mmol/L (ref 135–145)
TOTAL PROTEIN: 8 g/dL (ref 6.5–8.1)

## 2015-05-11 LAB — URINE MICROSCOPIC-ADD ON

## 2015-05-11 MED ORDER — ONDANSETRON 4 MG PO TBDP
4.0000 mg | ORAL_TABLET | Freq: Once | ORAL | Status: AC
  Administered 2015-05-11: 4 mg via ORAL
  Filled 2015-05-11: qty 1

## 2015-05-11 MED ORDER — AZITHROMYCIN 250 MG PO TABS: 250.0000 mg | ORAL_TABLET | Freq: Every day | ORAL | Status: DC

## 2015-05-11 MED ORDER — ALBUTEROL SULFATE HFA 108 (90 BASE) MCG/ACT IN AERS: 1.0000 | INHALATION_SPRAY | Freq: Four times a day (QID) | RESPIRATORY_TRACT | Status: DC | PRN

## 2015-05-11 MED ORDER — IPRATROPIUM-ALBUTEROL 0.5-2.5 (3) MG/3ML IN SOLN
3.0000 mL | Freq: Once | RESPIRATORY_TRACT | Status: AC
  Administered 2015-05-11: 3 mL via RESPIRATORY_TRACT
  Filled 2015-05-11: qty 3

## 2015-05-11 NOTE — ED Notes (Signed)
Pt states she started feeling bad Thursday. Symptoms of coughing, sneezing, nasal congestion, wheezing, and vomiting. Last vomited this morning.

## 2015-05-11 NOTE — ED Provider Notes (Signed)
CSN: [id number]     Arrival date & time 05/11/15  0805 History   First MD Initiated Contact with Patient 05/11/15 [phone]     Chief Complaint  Patient presents with  . Cough     (Consider location/radiation/quality/duration/timing/severity/associated sxs/prior Treatment) Patient is a 24 y.o. female presenting with cough. The history is provided by the patient. No language interpreter was used.  Cough Cough characteristics:  Productive Sputum characteristics:  Nondescript Severity:  Moderate Onset quality:  Gradual Duration:  2 days Timing:  Constant Progression:  Worsening Chronicity:  New Smoker: no   Context: not sick contacts   Relieved by:  Nothing Worsened by:  Nothing tried Ineffective treatments:  None tried Associated symptoms: rhinorrhea, shortness of breath and sinus congestion     Past Medical History  Diagnosis Date  . Hypertension   . Diabetes mellitus without complication [facility])    Past Surgical History  Procedure Laterality Date  . Oophorectomy     No family history on file. Social History  Substance Use Topics  . Smoking status: Current Every Day Smoker    Types: Cigarettes  . Smokeless tobacco: None  . Alcohol Use: Yes   OB History    No data available     Review of Systems  HENT: Positive for rhinorrhea.   Respiratory: Positive for cough and shortness of breath.   All other systems reviewed and are negative.     Allergies  Cherry and Coconut flavor  Home Medications   Prior to Admission medications   Medication Sig Start Date End Date Taking? Authorizing Provider  ibuprofen (ADVIL,MOTRIN) 200 MG tablet Take 800 mg by mouth every 6 (six) hours as needed for fever or moderate pain.   Yes Historical Provider, MD  lisinopril-hydrochlorothiazide (PRINZIDE,ZESTORETIC) 20-12.5 MG tablet Take 1 tablet by mouth daily.   Yes Historical Provider, MD  metFORMIN (GLUCOPHAGE) 500 MG tablet Take 1,000 mg by mouth 2 (two) times daily with a meal.   Yes  Historical Provider, MD  hydrochlorothiazide (HYDRODIURIL) 12.5 MG tablet Take 2 tablets (25 mg total) by mouth daily. Patient not taking: Reported on 05/11/2015 11/07/14   Dione Booze, MD  lisinopril (PRINIVIL,ZESTRIL) 20 MG tablet Take 1 tablet (20 mg total) by mouth daily. Patient not taking: Reported on 05/11/2015 11/07/14   Dione Booze, MD   BP 162/111 mmHg  Pulse 114  Temp(Src) 98.2 F (36.8 C)  Resp 22  Ht  (1.702 m)  Wt 240 lb (108.863 kg)  BMI 37.58 kg/m2  SpO2 94%  LMP 04/20/2015 Physical Exam  Constitutional: She is oriented to person, place, and time. She appears well-developed and well-nourished.  HENT:  Head: Normocephalic and atraumatic.  Right Ear: External ear normal.  Left Ear: External ear normal.  Eyes: Conjunctivae and EOM are normal. Pupils are equal, round, and reactive to light.  Neck: Normal range of motion.  Cardiovascular: Normal rate and normal heart sounds.   Pulmonary/Chest: Effort normal.  Abdominal: Soft. She exhibits no distension.  Musculoskeletal: Normal range of motion.  Neurological: She is alert and oriented to person, place, and time.  Skin: Skin is warm.  Psychiatric: She has a normal mood and affect.  Nursing note and vitals reviewed.   ED Course  Procedures (including critical care time) Labs Review Labs Reviewed  CBC WITH DIFFERENTIAL/PLATELET - Abnormal; Notable for the following:    WBC 19.5 (*)    Hemoglobin 11.9 (*)    RDW 15.6 (*)    Neutro Abs 17.2 (*)  All other components within normal limits  COMPREHENSIVE METABOLIC PANEL - Abnormal; Notable for the following:    Glucose, Bld 116 (*)    ALT 13 (*)    All other components within normal limits    Imaging Review Dg Chest 2 View  05/11/2015  CLINICAL DATA:  Cough, shortness of breath, chest pain. EXAM: CHEST  2 VIEW COMPARISON:  November 07, 2014. FINDINGS: The heart size and mediastinal contours are within normal limits. Both lungs are clear. No pneumothorax or  pleural effusion is noted. The visualized skeletal structures are unremarkable. IMPRESSION: No active cardiopulmonary disease. Electronically Signed   By: Lupita RaiderJames  Green Jr, M.D.   On: 05/11/2015 09:31   I have personally reviewed and evaluated these images and lab results as part of my medical decision-making.   EKG Interpretation None      MDM  No pneumonia.   Pt did not take her blood pressure medication today.  Pt has been out of her inhaler.   Pt reports decreased nausea and is drinking after zofran odt   Final diagnoses:  Bronchitis  Asthma, mild persistent, with acute exacerbation  Essential hypertension    Albuterol zithromax Take your blood pressure medication See your Physicain for recheck in 2-3 days    Elson AreasLeslie K Sofia, PA-C 05/11/15 1132  Glynn OctaveStephen Rancour, MD 05/11/15 [phone]

## 2015-05-11 NOTE — Discharge Instructions (Signed)
Acute Bronchitis Bronchitis is inflammation of the airways that extend from the windpipe into the lungs (bronchi). The inflammation often causes mucus to develop. This leads to a cough, which is the most common symptom of bronchitis.  In acute bronchitis, the condition usually develops suddenly and goes away over time, usually in a couple weeks. Smoking, allergies, and asthma can make bronchitis worse. Repeated episodes of bronchitis may cause further lung problems.  CAUSES Acute bronchitis is most often caused by the same virus that causes a cold. The virus can spread from person to person (contagious) through coughing, sneezing, and touching contaminated objects. SIGNS AND SYMPTOMS   Cough.   Fever.   Coughing up mucus.   Body aches.   Chest congestion.   Chills.   Shortness of breath.   Sore throat.  DIAGNOSIS  Acute bronchitis is usually diagnosed through a physical exam. Your health care provider will also ask you questions about your medical history. Tests, such as chest X-rays, are sometimes done to rule out other conditions.  TREATMENT  Acute bronchitis usually goes away in a couple weeks. Oftentimes, no medical treatment is necessary. Medicines are sometimes given for relief of fever or cough. Antibiotic medicines are usually not needed but may be prescribed in certain situations. In some cases, an inhaler may be recommended to help reduce shortness of breath and control the cough. A cool mist vaporizer may also be used to help thin bronchial secretions and make it easier to clear the chest.  HOME CARE INSTRUCTIONS  Get plenty of rest.   Drink enough fluids to keep your urine clear or pale yellow (unless you have a medical condition that requires fluid restriction). Increasing fluids may help thin your respiratory secretions (sputum) and reduce chest congestion, and it will prevent dehydration.   Take medicines only as directed by your health care provider.  If  you were prescribed an antibiotic medicine, finish it all even if you start to feel better.  Avoid smoking and secondhand smoke. Exposure to cigarette smoke or irritating chemicals will make bronchitis worse. If you are a smoker, consider using nicotine gum or skin patches to help control withdrawal symptoms. Quitting smoking will help your lungs heal faster.   Reduce the chances of another bout of acute bronchitis by washing your hands frequently, avoiding people with cold symptoms, and trying not to touch your hands to your mouth, nose, or eyes.   Keep all follow-up visits as directed by your health care provider.  SEEK MEDICAL CARE IF: Your symptoms do not improve after 1 week of treatment.  SEEK IMMEDIATE MEDICAL CARE IF:  You develop an increased fever or chills.   You have chest pain.   You have severe shortness of breath.  You have bloody sputum.   You develop dehydration.  You faint or repeatedly feel like you are going to pass out.  You develop repeated vomiting.  You develop a severe headache. MAKE SURE YOU:   Understand these instructions.  Will watch your condition.  Will get help right away if you are not doing well or get worse.   This information is not intended to replace advice given to you by your health care provider. Make sure you discuss any questions you have with your health care provider.   Document Released: 08/07/2004 Document Revised: 07/21/2014 Document Reviewed: 12/21/2012 Elsevier Interactive Patient Education 2016 Elsevier Inc. Asthma Attack Prevention While you may not be able to control the fact that you have asthma, you can  can take actions to prevent asthma attacks. The best way to prevent asthma attacks is to maintain good control of your asthma. You can achieve this by: °· Taking your medicines as directed. °· Avoiding things that can irritate your airways or make your asthma symptoms worse (asthma triggers). °· Keeping track of how  well your asthma is controlled and of any changes in your symptoms. °· Responding quickly to worsening asthma symptoms (asthma attack). °· Seeking emergency care when it is needed. °WHAT ARE SOME WAYS TO PREVENT AN ASTHMA ATTACK? °Have a Plan °Work with your health care provider to create a written plan for managing and treating your asthma attacks (asthma action plan). This plan includes: °· A list of your asthma triggers and how you can avoid them. °· Information on when medicines should be taken and when their dosages should be changed. °· The use of a device that measures how well your lungs are working (peak flow meter). °Monitor Your Asthma °Use your peak flow meter and record your results in a journal every day. A drop in your peak flow numbers on one or more days may indicate the start of an asthma attack. This can happen even before you start to feel symptoms. You can prevent an asthma attack from getting worse by following the steps in your asthma action plan. °Avoid Asthma Triggers °Work with your asthma health care provider to find out what your asthma triggers are. This can be done by: °· Allergy testing. °· Keeping a journal that notes when asthma attacks occur and the factors that may have contributed to them. °· Determining if there are other medical conditions that are making your asthma worse. °Once you have determined your asthma triggers, take steps to avoid them. This may include avoiding excessive or prolonged exposure to: °· Dust. Have someone dust and vacuum your home for you once or twice a week. Using a high-efficiency particulate arrestance (HEPA) vacuum is best. °· Smoke. This includes campfire smoke, forest fire smoke, and secondhand smoke from tobacco products. °· Pet dander. Avoid contact with animals that you know you are allergic to. °· Allergens from trees, grasses or pollens. Avoid spending a lot of time outdoors when pollen counts are high, and on very windy days. °· Very cold,  dry, or humid air. °· Mold. °· Foods that contain high amounts of sulfites. °· Strong odors. °· Outdoor air pollutants, such as engine exhaust. °· Indoor air pollutants, such as aerosol sprays and fumes from household cleaners. °· Household pests, including dust mites and cockroaches, and pest droppings. °· Certain medicines, including NSAIDs. Always talk to your health care provider before stopping or starting any new medicines. °Medicines °Take over-the-counter and prescription medicines only as told by your health care provider. Many asthma attacks can be prevented by carefully following your medicine schedule. Taking your medicines correctly is especially important when you cannot avoid certain asthma triggers. °Act Quickly °If an asthma attack does happen, acting quickly can decrease how severe it is and how long it lasts. Take these steps:  °· Pay attention to your symptoms. If you are coughing, wheezing, or having difficulty breathing, do not wait to see if your symptoms go away on their own. Follow your asthma action plan. °· If you have followed your asthma action plan and your symptoms are not improving, call your health care provider or seek immediate medical care at the nearest hospital. °It is important to note how often you need to use your fast-acting   inhaler. If you are using your rescue inhaler more often, it may mean that your asthma is not under control. Adjusting your asthma treatment plan may help you to prevent future asthma attacks and help you to gain better control of your condition. HOW CAN I PREVENT AN ASTHMA ATTACK WHEN I EXERCISE? Follow advice from your health care provider about whether you should use your fast-acting inhaler before exercising. Many people with asthma experience exercise-induced bronchoconstriction (EIB). This condition often worsens during vigorous exercise in cold, humid, or dry environments. Usually, people with EIB can stay very active by pre-treating with a  fast-acting inhaler before exercising.   This information is not intended to replace advice given to you by your health care provider. Make sure you discuss any questions you have with your health care provider.   Document Released: 06/18/2009 Document Revised: 03/21/2015 Document Reviewed: 11/30/2014 Elsevier Interactive Patient Education [employer]2016 Elsevier Inc[employer]

## 2015-06-18 ENCOUNTER — Ambulatory Visit: Payer: Self-pay | Admitting: Pediatrics

## 2015-06-19 ENCOUNTER — Encounter: Payer: Self-pay | Admitting: Pediatrics

## 2015-08-01 ENCOUNTER — Ambulatory Visit: Payer: Self-pay | Admitting: Pediatrics

## 2015-08-02 ENCOUNTER — Encounter: Payer: Self-pay | Admitting: Pediatrics

## 2015-08-23 ENCOUNTER — Encounter: Payer: Self-pay | Admitting: Obstetrics & Gynecology

## 2015-08-30 ENCOUNTER — Telehealth (HOSPITAL_COMMUNITY): Payer: Self-pay | Admitting: *Deleted

## 2015-08-30 NOTE — Telephone Encounter (Signed)
Called pt and lmtcb due to needing to resch appt for Feb 20th, 2017 due to provider being out of office. 

## 2015-09-03 ENCOUNTER — Ambulatory Visit (HOSPITAL_COMMUNITY): Payer: Self-pay | Admitting: Psychiatry

## 2015-09-20 ENCOUNTER — Ambulatory Visit (INDEPENDENT_AMBULATORY_CARE_PROVIDER_SITE_OTHER): Payer: BLUE CROSS/BLUE SHIELD | Admitting: Adult Health

## 2015-09-20 ENCOUNTER — Encounter: Payer: Self-pay | Admitting: Adult Health

## 2015-09-20 VITALS — BP 160/100 | HR 92 | Ht 66.0 in | Wt 225.0 lb

## 2015-09-20 DIAGNOSIS — Z3009 Encounter for other general counseling and advice on contraception: Secondary | ICD-10-CM | POA: Diagnosis not present

## 2015-09-20 DIAGNOSIS — I10 Essential (primary) hypertension: Secondary | ICD-10-CM | POA: Diagnosis not present

## 2015-09-20 NOTE — Patient Instructions (Addendum)
Call with period  Will check insurance for para gard Intrauterine Device Information An intrauterine device (IUD) is inserted into your uterus to prevent pregnancy. There are two types of IUDs available:   Copper IUD--This type of IUD is wrapped in copper wire and is placed inside the uterus. Copper makes the uterus and fallopian tubes produce a fluid that kills sperm. The copper IUD can stay in place for 10 years.  Hormone IUD--This type of IUD contains the hormone progestin (synthetic progesterone). The hormone thickens the cervical mucus and prevents sperm from entering the uterus. It also thins the uterine lining to prevent implantation of a fertilized egg. The hormone can weaken or kill the sperm that get into the uterus. One type of hormone IUD can stay in place for 5 years, and another type can stay in place for 3 years. Your health care provider will make sure you are a good candidate for a contraceptive IUD. Discuss with your health care provider the possible side effects.  ADVANTAGES OF AN INTRAUTERINE DEVICE  IUDs are highly effective, reversible, long acting, and low maintenance.   There are no estrogen-related side effects.   An IUD can be used when breastfeeding.   IUDs are not associated with weight gain.   The copper IUD works immediately after insertion.   The hormone IUD works right away if inserted within 7 days of your period starting. You will need to use a backup method of birth control for 7 days if the hormone IUD is inserted at any other time in your cycle.  The copper IUD does not interfere with your female hormones.   The hormone IUD can make heavy menstrual periods lighter and decrease cramping.   The hormone IUD can be used for 3 or 5 years.   The copper IUD can be used for 10 years. DISADVANTAGES OF AN INTRAUTERINE DEVICE  The hormone IUD can be associated with irregular bleeding patterns.   The copper IUD can make your menstrual flow heavier  and more painful.   You may experience cramping and vaginal bleeding after insertion.    This information is not intended to replace advice given to you by your health care provider. Make sure you discuss any questions you have with your health care provider.   Document Released: 06/03/2004 Document Revised: 03/02/2013 Document Reviewed: 12/19/2012 Elsevier Interactive Patient Education [employer]2016 Elsevier Inc.

## 2015-09-20 NOTE — Progress Notes (Signed)
Subjective:     Patient ID: Patricia Ortiz, female   DOB: 07-27-1990, 25 y.o.   MRN: [id number]  HPI Patricia Ortiz is a 25 year old black female, married, G1P0 in to discuss birth control and she says she wants the para gard, she had elective ab last year, and her husband is disabled.She works 7p-7a at [employer].She said she took BP meds today. PCP is Marin OlpKatie Hemberg, NP.   Review of Systems Patient denies any headaches, hearing loss, fatigue, blurred vision, shortness of breath, chest pain, abdominal pain, problems with bowel movements, urination, or intercourse. No joint pain or mood swings at present, she is bipolar. Reviewed past medical,surgical, social and family history. Reviewed medications and allergies.     Objective:   Physical Exam BP 160/100 mmHg  Pulse 92  Ht 5\' 6"  (1.676 m)  Wt 225 lb (102.059 kg)  BMI 36.33 kg/m2  LMP 08/27/2015 Skin warm and dry. Neck: mid line trachea, normal thyroid, good ROM, no lymphadenopathy noted. Lungs: clear to ausculation bilaterally. Cardiovascular: regular rate and rhythm.Discussed para gard and mirena with her and she still wants para gard.Discussed with her that BP is elevated and she needs to call Florentina AddisonKatie and discuss with her today even though she has appt. In April and she says she will. Face time 20 minutes.    Assessment:     Contraceptive counseling     Plan:     Will check insurance and order para gard Call when period starts for insertion Follow up with Florentina AddisonKatie about BP

## 2015-09-28 ENCOUNTER — Ambulatory Visit (INDEPENDENT_AMBULATORY_CARE_PROVIDER_SITE_OTHER): Payer: BLUE CROSS/BLUE SHIELD | Admitting: Obstetrics and Gynecology

## 2015-09-28 ENCOUNTER — Encounter: Payer: Self-pay | Admitting: Obstetrics and Gynecology

## 2015-09-28 VITALS — BP 120/84 | Ht 66.0 in | Wt 224.0 lb

## 2015-09-28 DIAGNOSIS — IMO0002 Reserved for concepts with insufficient information to code with codable children: Secondary | ICD-10-CM

## 2015-09-28 DIAGNOSIS — Z30014 Encounter for initial prescription of intrauterine contraceptive device: Secondary | ICD-10-CM | POA: Diagnosis not present

## 2015-09-28 DIAGNOSIS — Z30430 Encounter for insertion of intrauterine contraceptive device: Secondary | ICD-10-CM | POA: Diagnosis not present

## 2015-09-28 DIAGNOSIS — Z3202 Encounter for pregnancy test, result negative: Secondary | ICD-10-CM | POA: Diagnosis not present

## 2015-09-28 DIAGNOSIS — Z3200 Encounter for pregnancy test, result unknown: Secondary | ICD-10-CM

## 2015-09-28 LAB — POCT URINE PREGNANCY: PREG TEST UR: NEGATIVE

## 2015-09-28 NOTE — Progress Notes (Signed)
Coralie CommonYelixsa G Kofman is a 25 y.o. G1P0010 here for Mirena IUD insertion. No GYN concerns.  Last pap smear was on 09/21/2012 and was significant for ASC-US.  IUD Insertion  Patient identified, informed consent performed. Discussed risks of irregular bleeding, cramping, infection, malpositioning or misplacement of the IUD outside the uterus which may require further procedures. Time out was performed. Speculum placed in the vagina. Cervix visualized. Cleaned with Betadine x 2. Grasped anteriorly with a single tooth tenaculum. Retroverted uterus sounded to 7.5 cm. Mirena IUD placed per manufacturer's recommendations. Strings trimmed to 2-3 cm. Tenaculum was removed, good hemostasis noted. Patient tolerated procedure well. Patient was given post-procedure instructions.  Patient was also asked to check IUD strings periodically and offered follow up in 4 weeks for IUD check.  By signing my name below, I, Soijett Blue, attest that this documentation has been prepared under the direction and in the presence of Tilda BurrowJohn Neelah Mannings V, MD. Electronically Signed: Soijett Blue, ED Scribe. 09/28/2015. 1:34 PM.  I personally performed the services described in this documentation, which was SCRIBED in my presence. The recorded information has been reviewed and considered accurate. It has been edited as necessary during review. Tilda BurrowFERGUSON,Tabor Denham V, MD

## 2015-09-30 DIAGNOSIS — IMO0002 Reserved for concepts with insufficient information to code with codable children: Secondary | ICD-10-CM | POA: Insufficient documentation

## 2015-09-30 DIAGNOSIS — Z309 Encounter for contraceptive management, unspecified: Secondary | ICD-10-CM | POA: Insufficient documentation

## 2015-10-23 ENCOUNTER — Telehealth (HOSPITAL_COMMUNITY): Payer: Self-pay | Admitting: *Deleted

## 2015-11-01 ENCOUNTER — Ambulatory Visit: Payer: BLUE CROSS/BLUE SHIELD | Admitting: Obstetrics and Gynecology

## 2015-11-01 NOTE — Telephone Encounter (Signed)
Fail to keep appt. Unable to access pt by phone numbers of record.

## 2015-12-17 ENCOUNTER — Ambulatory Visit (HOSPITAL_COMMUNITY): Payer: Self-pay | Admitting: Psychiatry

## 2016-03-21 ENCOUNTER — Encounter: Payer: Self-pay | Admitting: Obstetrics and Gynecology

## 2016-03-21 ENCOUNTER — Ambulatory Visit (INDEPENDENT_AMBULATORY_CARE_PROVIDER_SITE_OTHER): Payer: BLUE CROSS/BLUE SHIELD | Admitting: Obstetrics and Gynecology

## 2016-03-21 VITALS — BP 168/108 | HR 79 | Ht 66.0 in | Wt 213.8 lb

## 2016-03-21 DIAGNOSIS — Z30432 Encounter for removal of intrauterine contraceptive device: Secondary | ICD-10-CM

## 2016-03-21 NOTE — Progress Notes (Addendum)
[facility] CL[facility] PROCEDURE NOTE  Patricia Ortiz is a 25 y.o. G1P0010 here for Mirena IUD removal. No GYN concerns. Last pap smear was normal  . Pt states that she would like her IUD removed due to spontaneous cramping and increased bleeding during menstrual cycle. Pt reports that prior to IUD insertion, she used 3 pads in one day and then while on the IUD she used 8 pads in one day. Pt states that she has had a left salpingectomy due to an enlarged ovarian cyst 6 years ago. Pt denies any other symptoms. Pt denies having any children, but notes that her husband has 2 adult children. Pt reports that she was last seen for her HTN issues 8 months ago and with a creatine within range.    IUD Removal  Patient was in the dorsal lithotomy position, normal external genitalia was noted.  A speculum was placed in the patient's vagina, normal discharge was noted, no lesions. The multiparous cervix was visualized, no lesions, no abnormal discharge;  and the cervix was swabbed with Betadine using scopettes. The strings of the IUD were grasped and pulled using ring forceps.  The IUD was successfully removed in its entirety.  Patient tolerated the procedure well.    Future contraception: Pt denies any future contraception, but notes that is planning for a future pregnancy.  By signing my name below, I, Soijett Blue, attest that this documentation has been prepared under the direction and in the presence of Tilda BurrowJohn V Ferguson, MD. Electronically Signed: Soijett Blue, ED Scribe. 03/21/16. 9:52 AM.   I personally performed the services described in this documentation, which was SCRIBED in my presence. The recorded information has been reviewed and considered accurate. It has been edited as necessary during review. Tilda BurrowFERGUSON,JOHN V, MD

## 2016-03-27 DIAGNOSIS — Z30432 Encounter for removal of intrauterine contraceptive device: Secondary | ICD-10-CM | POA: Insufficient documentation

## 2016-09-19 ENCOUNTER — Ambulatory Visit: Payer: Self-pay | Admitting: Family Medicine

## 2016-09-19 DIAGNOSIS — F319 Bipolar disorder, unspecified: Secondary | ICD-10-CM | POA: Insufficient documentation

## 2016-09-19 DIAGNOSIS — E785 Hyperlipidemia, unspecified: Secondary | ICD-10-CM | POA: Insufficient documentation

## 2016-09-19 DIAGNOSIS — Z9114 Patient's other noncompliance with medication regimen: Secondary | ICD-10-CM

## 2016-09-19 DIAGNOSIS — Z9111 Patient's noncompliance with dietary regimen: Secondary | ICD-10-CM | POA: Insufficient documentation

## 2016-09-19 DIAGNOSIS — E119 Type 2 diabetes mellitus without complications: Secondary | ICD-10-CM | POA: Insufficient documentation

## 2016-09-19 DIAGNOSIS — E282 Polycystic ovarian syndrome: Secondary | ICD-10-CM | POA: Insufficient documentation

## 2016-11-06 ENCOUNTER — Ambulatory Visit (INDEPENDENT_AMBULATORY_CARE_PROVIDER_SITE_OTHER): Payer: BLUE CROSS/BLUE SHIELD | Admitting: Psychiatry

## 2016-11-06 VITALS — BP 138/89 | HR 90 | Ht 66.0 in | Wt 227.4 lb

## 2016-11-06 DIAGNOSIS — Z818 Family history of other mental and behavioral disorders: Secondary | ICD-10-CM

## 2016-11-06 DIAGNOSIS — F17210 Nicotine dependence, cigarettes, uncomplicated: Secondary | ICD-10-CM

## 2016-11-06 DIAGNOSIS — Z79899 Other long term (current) drug therapy: Secondary | ICD-10-CM | POA: Diagnosis not present

## 2016-11-06 DIAGNOSIS — F3181 Bipolar II disorder: Secondary | ICD-10-CM | POA: Diagnosis not present

## 2016-11-06 MED ORDER — ARIPIPRAZOLE 5 MG PO TABS: 5.0000 mg | ORAL_TABLET | Freq: Every day | ORAL | 2 refills | Status: DC

## 2016-11-06 MED ORDER — TRAZODONE HCL 100 MG PO TABS: 100.0000 mg | ORAL_TABLET | Freq: Every day | ORAL | 2 refills | Status: DC

## 2016-11-06 NOTE — Patient Instructions (Signed)
STOP Seroquel  Take Abilify 5 mg daily  Take Trazodone 1/2 or 1 whole tablet at bedtime for sleep   Come back and check in after 2-3 months
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
## 2017-01-15 ENCOUNTER — Ambulatory Visit (HOSPITAL_COMMUNITY): Payer: Self-pay | Admitting: Psychiatry

## 2017-02-11 ENCOUNTER — Emergency Department (HOSPITAL_COMMUNITY)
Admission: EM | Admit: 2017-02-11 | Discharge: 2017-02-11 | Disposition: A | Discharge status: Home / Self Care | Payer: BLUE CROSS/BLUE SHIELD | Attending: Emergency Medicine | Admitting: Emergency Medicine

## 2017-02-11 ENCOUNTER — Encounter (HOSPITAL_COMMUNITY): Payer: Self-pay | Admitting: Emergency Medicine

## 2017-02-11 ENCOUNTER — Emergency Department (HOSPITAL_COMMUNITY): Payer: BLUE CROSS/BLUE SHIELD

## 2017-02-11 DIAGNOSIS — F17210 Nicotine dependence, cigarettes, uncomplicated: Secondary | ICD-10-CM | POA: Insufficient documentation

## 2017-02-11 DIAGNOSIS — Z79899 Other long term (current) drug therapy: Secondary | ICD-10-CM | POA: Diagnosis not present

## 2017-02-11 DIAGNOSIS — M5432 Sciatica, left side: Secondary | ICD-10-CM | POA: Diagnosis not present

## 2017-02-11 DIAGNOSIS — Z7984 Long term (current) use of oral hypoglycemic drugs: Secondary | ICD-10-CM | POA: Diagnosis not present

## 2017-02-11 DIAGNOSIS — I10 Essential (primary) hypertension: Secondary | ICD-10-CM | POA: Insufficient documentation

## 2017-02-11 DIAGNOSIS — E119 Type 2 diabetes mellitus without complications: Secondary | ICD-10-CM | POA: Diagnosis not present

## 2017-02-11 DIAGNOSIS — M545 Low back pain: Secondary | ICD-10-CM | POA: Diagnosis present

## 2017-02-11 LAB — URINALYSIS, ROUTINE W REFLEX MICROSCOPIC
Bilirubin Urine: NEGATIVE
GLUCOSE, UA: NEGATIVE mg/dL
HGB URINE DIPSTICK: NEGATIVE
KETONES UR: NEGATIVE mg/dL
Leukocytes, UA: NEGATIVE
Nitrite: NEGATIVE
PROTEIN: NEGATIVE mg/dL
Specific Gravity, Urine: 1.013 (ref 1.005–1.030)
pH: 6 (ref 5.0–8.0)

## 2017-02-11 LAB — PREGNANCY, URINE: PREG TEST UR: NEGATIVE

## 2017-02-11 MED ORDER — CYCLOBENZAPRINE HCL 10 MG PO TABS
10.0000 mg | ORAL_TABLET | Freq: Once | ORAL | Status: AC
  Administered 2017-02-11: 10 mg via ORAL
  Filled 2017-02-11: qty 1

## 2017-02-11 MED ORDER — IBUPROFEN 800 MG PO TABS: 800.0000 mg | ORAL_TABLET | Freq: Three times a day (TID) | ORAL | 0 refills | Status: DC

## 2017-02-11 MED ORDER — OXYCODONE-ACETAMINOPHEN 5-325 MG PO TABS
1.0000 | ORAL_TABLET | Freq: Once | ORAL | Status: AC
  Administered 2017-02-11: 1 via ORAL
  Filled 2017-02-11: qty 1

## 2017-02-11 MED ORDER — CYCLOBENZAPRINE HCL 10 MG PO TABS: 10.0000 mg | ORAL_TABLET | Freq: Three times a day (TID) | ORAL | 0 refills | Status: DC | PRN

## 2017-02-11 MED ORDER — OXYCODONE-ACETAMINOPHEN 5-325 MG PO TABS: 1.0000 | ORAL_TABLET | ORAL | 0 refills | Status: DC | PRN

## 2017-02-11 NOTE — ED Provider Notes (Signed)
[facility]EM[facility]ENCY DEPT Provider Note   CSN: [id number] Arrival date & time: 02/11/17  1737     History   Chief Complaint Chief Complaint  Patient presents with  . Back Pain    HPI Patricia Ortiz is a 26 y.o. female.  HPI  Patricia Ortiz is a 26 y.o. female who presents to the Emergency Department complaining of Left low back pain for 3-4 weeks. She states the pain has been gradual in onset and worsening for several days. She describes a sharp, throbbing pain that radiates from her lower back into her left hip and thigh. Pain is associated with weightbearing and lying supine. She denies known injury. Pain has been unrelieved with over-the-counter pain relievers. She denies known injury, fever, chills, abdominal pain, numbness or weakness of the lower extremities, urine or bowel changes.  Past Medical History:  Diagnosis Date  . Bipolar 1 disorder (HCC)   . Diabetes mellitus without complication (HCC)   . Hypertension   . PCOS (polycystic ovarian syndrome)     Patient Active Problem List   Diagnosis Date Noted  . Type 2 diabetes mellitus (HCC) 09/19/2016  . Noncompliance with diet and medication regimen 09/19/2016  . Morbid obesity (HCC) 09/19/2016  . PCOS (polycystic ovarian syndrome) 09/19/2016  . HLD (hyperlipidemia) 09/19/2016  . Bipolar disorder with depression (HCC) 09/19/2016  . 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 Hypertension 09/20/2015    Past Surgical History:  Procedure Laterality Date  . OOPHORECTOMY      OB History    Gravida Para Term Preterm AB Living   1       1 0   SAB TAB Ectopic Multiple Live Births     1             Home Medications    Prior to Admission medications   Medication Sig Start Date End Date Taking? Authorizing Provider  albuterol (PROVENTIL HFA;VENTOLIN HFA) 108 (90 BASE) MCG/ACT inhaler Inhale 1-2 puffs into the lungs every 6 (six) hours as needed for wheezing or shortness of breath. Patient not taking:  Reported on 09/20/2015 05/11/15   Elson AreasSofia, Leslie K, PA-C  amLODipine (NORVASC) 5 MG tablet 5 mg daily.  09/12/15   [provider]  ARIPiprazole (ABILIFY) 5 MG tablet Take 1 tablet (5 mg total) by mouth daily. 11/06/16 11/06/17  Burnard LeighEksir, Alexander Arya, MD  ibuprofen (ADVIL,MOTRIN) 200 MG tablet Take 800 mg by mouth every 6 (six) hours as needed for fever or moderate pain.    [provider]  lisinopril-hydrochlorothiazide (PRINZIDE,ZESTORETIC) 20-25 MG tablet 1 tablet daily.  09/12/15   [provider]  lovastatin (MEVACOR) 10 MG tablet Take 10 mg by mouth at bedtime.  09/12/15   [provider]  metFORMIN (GLUCOPHAGE) 500 MG tablet Take 1,000 mg by mouth 2 (two) times daily with a meal.    [provider]  traZODone (DESYREL) 100 MG tablet Take 1 tablet (100 mg total) by mouth at bedtime. 11/06/16   Eksir, Bo McclintockAlexander Arya, MD    Family History Family History  Problem Relation Age of Onset  . Diabetes Mother   . Hypertension Mother   . Hyperlipidemia Mother   . Bipolar disorder Mother   . Diabetes Father   . Hypertension Father   . Hyperlipidemia Father   . Heart disease Father   . Polycystic ovary syndrome Sister   . Diabetes Maternal Grandmother   . Hypertension Maternal Grandmother   . Diabetes Maternal Grandfather   .  Hypertension Maternal Grandfather   . Diabetes Paternal Grandmother   . Hypertension Paternal Grandmother   . Heart disease Paternal Grandmother   . Diabetes Paternal Grandfather   . Hypertension Paternal Grandfather   . Diabetes Sister   . Cancer Other        ovarian    Social History Social History  Substance Use Topics  . Smoking status: Current Every Day Smoker    Packs/day: 0.25    Years: 16.00    Types: Cigarettes  . Smokeless tobacco: Never Used     Comment: smokes 5-6 cig daily  . Alcohol use Yes     Comment: occ     Allergies   Cherry [wild cherry syrup]; Coconut flavor; Pineapple; and Hydrocodone   Review  of Systems Review of Systems  Constitutional: Negative for fever.  Respiratory: Negative for shortness of breath.   Gastrointestinal: Negative for abdominal pain, constipation and vomiting.  Genitourinary: Negative for decreased urine volume, difficulty urinating, dysuria, flank pain and hematuria.  Musculoskeletal: Positive for back pain. Negative for joint swelling.  Skin: Negative for rash.  Neurological: Negative for weakness and numbness.  All other systems reviewed and are negative.    Physical Exam Updated Vital Signs BP (!) 185/105   Pulse 77   Temp 98.1 F (36.7 C) (Oral)   Resp 18   Ht 5\' 6"  (1.676 m)   Wt 104.3 kg (230 lb)   LMP 12/29/2016   SpO2 100%   BMI 37.12 kg/m   Physical Exam  Constitutional: She is oriented to person, place, and time. She appears well-developed and well-nourished. Ortiz distress.  HENT:  Head: Normocephalic and atraumatic.  Neck: Normal range of motion. Neck supple.  Cardiovascular: Normal rate, regular rhythm, normal heart sounds and intact distal pulses.   Ortiz murmur heard. Pulmonary/Chest: Effort normal and breath sounds normal. Ortiz respiratory distress.  Abdominal: Soft. She exhibits Ortiz distension and Ortiz mass. There is Ortiz tenderness. There is Ortiz guarding.  Musculoskeletal: She exhibits tenderness. She exhibits Ortiz edema.       Lumbar back: She exhibits tenderness and pain. She exhibits normal range of motion, Ortiz swelling, Ortiz deformity, Ortiz laceration and normal pulse.  ttp of the lower lumbar spine and left paraspinal muscles and SI joint.  Pain reproduced on straight leg raise on the left at 20.  Pt has 5/5 strength against resistance of bilateral lower extremities.     Neurological: She is alert and oriented to person, place, and time. She has normal strength. Ortiz sensory deficit. She exhibits normal muscle tone. Coordination and gait normal.  Reflex Scores:      Patellar reflexes are 2+ on the right side and 2+ on the left side.       Achilles reflexes are 2+ on the right side and 2+ on the left side. Skin: Skin is warm and dry. Ortiz rash noted.  Nursing note and vitals reviewed.    ED Treatments / Results  Labs (all labs ordered are listed, but only abnormal results are displayed) Labs Reviewed  PREGNANCY, URINE  URINALYSIS, ROUTINE W REFLEX MICROSCOPIC    EKG  EKG Interpretation None       Radiology Dg Lumbar Spine Complete  Result Date: 02/11/2017 CLINICAL DATA:  26 year old female with lower back pain. Ortiz known injury. EXAM: LUMBAR SPINE - COMPLETE 4+ VIEW COMPARISON:  None. FINDINGS: There is Ortiz evidence of lumbar spine fracture. Alignment is normal. Intervertebral disc spaces are maintained. IMPRESSION: Negative. Electronically Signed  By: Elgie Collard M.D.   On: 02/11/2017 19:35    Procedures Procedures (including critical care time)  Medications Ordered in ED Medications  cyclobenzaprine (FLEXERIL) tablet 10 mg (10 mg Oral Given 02/11/17 1824)  oxyCODONE-acetaminophen (PERCOCET/ROXICET) 5-325 MG per tablet 1 tablet (1 tablet Oral Given 02/11/17 1824)     Initial Impression / Assessment and Plan / ED Course  I have reviewed the triage vital signs and the nursing notes.  Pertinent labs & imaging results that were available during my care of the patient were reviewed by me and considered in my medical decision making (see chart for details).     Patient is well-appearing. Vital signs are stable. She is mildly hypertensive, but does take antihypertensive medication. She denies symptoms other than low back pain. She is ambulated to the restroom without difficulty. Gait is steady. Ortiz focal neurological deficits on exam. Symptoms are likely related to lumbar radiculopathy.  Final Clinical Impressions(s) / ED Diagnoses   Final diagnoses:  Sciatica of left side    New Prescriptions New Prescriptions   Ortiz medications on file     Pauline Aus, Cordelia Poche 02/11/17 2025    Maia Plan,  MD 02/11/17 2101

## 2017-02-11 NOTE — ED Notes (Signed)
ED Provider at bedside. 

## 2017-02-11 NOTE — Discharge Instructions (Signed)
Alternate ice and heat to your back. Avoid twisting and bending movements for one week. Follow-up to your primary doctor for recheck if not improving.

## 2017-02-11 NOTE — ED Triage Notes (Signed)
Pt reports lower back pain radiating to LLE x3 weeks. Pt denies any known injury. Pt also reports is 14 days late on period. Pt reports took a pregnancy test that resulted negative.

## 2017-02-16 ENCOUNTER — Other Ambulatory Visit (HOSPITAL_COMMUNITY): Payer: Self-pay

## 2017-02-16 DIAGNOSIS — F3181 Bipolar II disorder: Secondary | ICD-10-CM

## 2017-02-16 MED ORDER — ARIPIPRAZOLE 5 MG PO TABS: 5.0000 mg | ORAL_TABLET | Freq: Every day | ORAL | 0 refills | Status: DC

## 2017-02-16 MED ORDER — TRAZODONE HCL 100 MG PO TABS: 100.0000 mg | ORAL_TABLET | Freq: Every day | ORAL | 0 refills | Status: DC

## 2017-02-16 NOTE — Progress Notes (Unsigned)
Patient called and hasa  Follow up later this month, she needs refills on Abilify and Trazodone. Per protocol - patient was seen 3 months ago, she has a follow up appointment and he medication is due - I sent a 30 day order of both medications to the pharmacy and called patient to let her know.

## 2017-02-18 ENCOUNTER — Other Ambulatory Visit (HOSPITAL_COMMUNITY): Payer: Self-pay | Admitting: Psychiatry

## 2017-02-18 DIAGNOSIS — F3181 Bipolar II disorder: Secondary | ICD-10-CM

## 2017-03-06 ENCOUNTER — Ambulatory Visit (INDEPENDENT_AMBULATORY_CARE_PROVIDER_SITE_OTHER): Payer: BLUE CROSS/BLUE SHIELD | Admitting: Psychiatry

## 2017-03-06 ENCOUNTER — Encounter (HOSPITAL_COMMUNITY): Payer: Self-pay | Admitting: Psychiatry

## 2017-03-06 DIAGNOSIS — Z79899 Other long term (current) drug therapy: Secondary | ICD-10-CM | POA: Diagnosis not present

## 2017-03-06 DIAGNOSIS — F17210 Nicotine dependence, cigarettes, uncomplicated: Secondary | ICD-10-CM | POA: Diagnosis not present

## 2017-03-06 DIAGNOSIS — G479 Sleep disorder, unspecified: Secondary | ICD-10-CM | POA: Diagnosis not present

## 2017-03-06 DIAGNOSIS — F3181 Bipolar II disorder: Secondary | ICD-10-CM

## 2017-03-06 MED ORDER — LAMOTRIGINE 25 MG PO TABS: ORAL_TABLET | ORAL | 1 refills | Status: AC

## 2017-03-06 MED ORDER — TRAZODONE HCL 100 MG PO TABS: 200.0000 mg | ORAL_TABLET | Freq: Every day | ORAL | 1 refills | Status: DC

## 2017-03-06 NOTE — Patient Instructions (Addendum)
Continue Abilify for 2 more weeks, then discontinue  Start lamictal 25 mg daily for 1 week, then increase to 50 mg daily  After 2 weeks on lamictal, increase to 100 mg daily (4 tablets)
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
## 2017-03-09 ENCOUNTER — Telehealth (HOSPITAL_COMMUNITY): Payer: Self-pay | Admitting: *Deleted

## 2017-03-09 NOTE — Telephone Encounter (Signed)
left voice message regarding an appointment. 

## 2017-05-15 ENCOUNTER — Ambulatory Visit (HOSPITAL_COMMUNITY): Payer: Self-pay | Admitting: Psychiatry

## 2017-05-22 ENCOUNTER — Encounter (HOSPITAL_COMMUNITY): Payer: Self-pay | Admitting: *Deleted

## 2017-05-22 ENCOUNTER — Emergency Department (HOSPITAL_COMMUNITY)
Admission: EM | Admit: 2017-05-22 | Discharge: 2017-05-22 | Disposition: A | Discharge status: Home / Self Care | Payer: BLUE CROSS/BLUE SHIELD | Attending: Emergency Medicine | Admitting: Emergency Medicine

## 2017-05-22 ENCOUNTER — Emergency Department (HOSPITAL_COMMUNITY): Payer: BLUE CROSS/BLUE SHIELD

## 2017-05-22 ENCOUNTER — Other Ambulatory Visit: Payer: Self-pay

## 2017-05-22 DIAGNOSIS — E119 Type 2 diabetes mellitus without complications: Secondary | ICD-10-CM | POA: Diagnosis not present

## 2017-05-22 DIAGNOSIS — R0602 Shortness of breath: Secondary | ICD-10-CM | POA: Diagnosis present

## 2017-05-22 DIAGNOSIS — I10 Essential (primary) hypertension: Secondary | ICD-10-CM | POA: Insufficient documentation

## 2017-05-22 DIAGNOSIS — J189 Pneumonia, unspecified organism: Secondary | ICD-10-CM | POA: Diagnosis not present

## 2017-05-22 DIAGNOSIS — F17210 Nicotine dependence, cigarettes, uncomplicated: Secondary | ICD-10-CM | POA: Diagnosis not present

## 2017-05-22 MED ORDER — AZITHROMYCIN 250 MG PO TABS: 250.0000 mg | ORAL_TABLET | Freq: Every day | ORAL | 0 refills | Status: DC

## 2017-05-22 MED ORDER — ALBUTEROL SULFATE HFA 108 (90 BASE) MCG/ACT IN AERS
2.0000 | INHALATION_SPRAY | Freq: Once | RESPIRATORY_TRACT | Status: AC
  Administered 2017-05-22: 2 via RESPIRATORY_TRACT
  Filled 2017-05-22: qty 6.7

## 2017-05-22 MED ORDER — AMOXICILLIN 500 MG PO CAPS: 500.0000 mg | ORAL_CAPSULE | Freq: Three times a day (TID) | ORAL | 0 refills | Status: DC

## 2017-05-22 MED ORDER — DEXAMETHASONE 4 MG PO TABS
12.0000 mg | ORAL_TABLET | Freq: Once | ORAL | Status: AC
  Administered 2017-05-22: 12 mg via ORAL
  Filled 2017-05-22: qty 3

## 2017-05-22 MED ORDER — IBUPROFEN 400 MG PO TABS
600.0000 mg | ORAL_TABLET | Freq: Once | ORAL | Status: AC
  Administered 2017-05-22: 600 mg via ORAL
  Filled 2017-05-22: qty 2

## 2017-05-22 MED ORDER — AMOXICILLIN 250 MG PO CAPS
1000.0000 mg | ORAL_CAPSULE | Freq: Once | ORAL | Status: AC
  Administered 2017-05-22: 1000 mg via ORAL
  Filled 2017-05-22: qty 4

## 2017-05-22 MED ORDER — AZITHROMYCIN 250 MG PO TABS
500.0000 mg | ORAL_TABLET | Freq: Once | ORAL | Status: AC
  Administered 2017-05-22: 500 mg via ORAL
  Filled 2017-05-22: qty 2

## 2017-05-22 MED ORDER — GUAIFENESIN-CODEINE 100-10 MG/5ML PO SOLN
10.0000 mL | Freq: Once | ORAL | Status: AC
  Administered 2017-05-22: 10 mL via ORAL
  Filled 2017-05-22: qty 10

## 2017-05-22 NOTE — ED Notes (Signed)
Dr Kohut at bedside,  

## 2017-05-22 NOTE — ED Notes (Signed)
Patient transported to X-ray 

## 2017-05-22 NOTE — ED Notes (Signed)
Pt reports cough with "very little sputum production" congestion nasal congestion, body aches for the past few days, worse today,

## 2017-05-22 NOTE — ED Triage Notes (Signed)
Pt reports cough, congestion, sore throat, nasal drainage, bilateral rib pain x 2 days and sob x 6 hrs.

## 2017-06-03 NOTE — ED Provider Notes (Signed)
[facility] EMERGENCY DEPARTMENT Provider Note   CSN: [id number] Arrival date & time: 05/22/17  2051     History   Chief Complaint Chief Complaint  Patient presents with  . Shortness of Breath    HPI Patricia Ortiz is a 26 y.o. female.  HPI   26 year old female with cough and shortness of breath.  Symptom onset 2 days ago.  She is having sharp, bilateral chest pain in the axilla when she coughs.  Occasionally productive for yellow sputum.  No hemoptysis.  Subjective fevers.  No unusual leg pain or swelling.  Past Medical History:  Diagnosis Date  . Bipolar 1 disorder (HCC)   . Diabetes mellitus without complication (HCC)   . Hypertension   . PCOS (polycystic ovarian syndrome)     Patient Active Problem List   Diagnosis Date Noted  . Type 2 diabetes mellitus (HCC) 09/19/2016  . Noncompliance with diet and medication regimen 09/19/2016  . Morbid obesity (HCC) 09/19/2016  . PCOS (polycystic ovarian syndrome) 09/19/2016  . HLD (hyperlipidemia) 09/19/2016  . Bipolar disorder with depression (HCC) 09/19/2016  . 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 Hypertension 09/20/2015    Past Surgical History:  Procedure Laterality Date  . OOPHORECTOMY      OB History    Gravida Para Term Preterm AB Living   1       1 0   SAB TAB Ectopic Multiple Live Births     1             Home Medications    Prior to Admission medications   Medication Sig Start Date End Date Taking? Authorizing Provider  amoxicillin (AMOXIL) 500 MG capsule Take 1 capsule (500 mg total) 3 (three) times daily by mouth. 05/22/17   Raeford Razor, MD  azithromycin (ZITHROMAX) 250 MG tablet Take 1 tablet (250 mg total) daily by mouth. 1 tablet daily starting on 05/23/17 05/22/17   Raeford Razor, MD  ibuprofen (ADVIL,MOTRIN) 800 MG tablet Take 1 tablet (800 mg total) by mouth 3 (three) times daily. 02/11/17   Triplett, Tammy, PA-C  lamoTRIgine (LAMICTAL) 25 MG tablet Take 25 mg daily for 1  week, then increase to 50 mg daily for 1 week.  After 2 weeks, increase to 100 mg daily 03/06/17   Burnard Leigh, MD  lisinopril-hydrochlorothiazide (PRINZIDE,ZESTORETIC) 20-25 MG tablet 1 tablet daily.  09/12/15   [provider]  lovastatin (MEVACOR) 10 MG tablet Take 10 mg by mouth at bedtime.  09/12/15   [provider]  traZODone (DESYREL) 100 MG tablet Take 2 tablets (200 mg total) by mouth at bedtime. 03/06/17 03/06/18  Burnard Leigh, MD    Family History Family History  Problem Relation Age of Onset  . Diabetes Mother   . Hypertension Mother   . Hyperlipidemia Mother   . Bipolar disorder Mother   . Diabetes Father   . Hypertension Father   . Hyperlipidemia Father   . Heart disease Father   . Polycystic ovary syndrome Sister   . Diabetes Maternal Grandmother   . Hypertension Maternal Grandmother   . Diabetes Maternal Grandfather   . Hypertension Maternal Grandfather   . Diabetes Paternal Grandmother   . Hypertension Paternal Grandmother   . Heart disease Paternal Grandmother   . Diabetes Paternal Grandfather   . Hypertension Paternal Grandfather   . Diabetes Sister   . Cancer Other        ovarian    Social History Social  History   Tobacco Use  . Smoking status: Current Every Day Smoker    Packs/day: 0.25    Years: 16.00    Pack years: 4.00    Types: Cigarettes  . Smokeless tobacco: Never Used  . Tobacco comment: smokes 5-6 cig daily  Substance Use Topics  . Alcohol use: Yes    Comment: occ  . Drug use: No     Allergies   Cherry [wild cherry syrup]; Coconut flavor; Pineapple; and Hydrocodone   Review of Systems Review of Systems  All systems reviewed and negative, other than as noted in HPI.  Physical Exam Updated Vital Signs BP (!) 207/104 Comment: Dr Juleen ChinaKohut aware of vital signs  Pulse (!) 106   Temp 99.3 F (37.4 C) (Oral)   Resp 20   Ht 5\' 6"  (1.676 m)   Wt 104.3 kg (230 lb)   LMP 05/04/2017   SpO2 97%   BMI  37.12 kg/m   Physical Exam  Constitutional: She appears well-developed and well-nourished. No distress.  HENT:  Head: Normocephalic and atraumatic.  Eyes: Conjunctivae are normal. Right eye exhibits no discharge. Left eye exhibits no discharge.  Neck: Neck supple.  Cardiovascular: Normal rate, regular rhythm and normal heart sounds. Exam reveals no gallop and no friction rub.  No murmur heard. Pulmonary/Chest: Effort normal. No respiratory distress. She has wheezes.  Abdominal: Soft. She exhibits no distension. There is no tenderness.  Musculoskeletal: She exhibits no edema or tenderness.  Lower extremities symmetric as compared to each other. No calf tenderness. Negative Homan's. No palpable cords.   Neurological: She is alert.  Skin: Skin is warm and dry.  Psychiatric: She has a normal mood and affect. Her behavior is normal. Thought content normal.  Nursing note and vitals reviewed.    ED Treatments / Results  Labs (all labs ordered are listed, but only abnormal results are displayed) Labs Reviewed - No data to display  EKG  EKG Interpretation None       Radiology No results found.   Dg Chest 2 View  Result Date: 05/22/2017 CLINICAL DATA:  Short of breath EXAM: CHEST  2 VIEW COMPARISON:  05/11/2015 FINDINGS: Patchy infiltrate at the left lung base. No pleural effusion. Normal heart size. No pneumothorax. IMPRESSION: Patchy left lower lung infiltrate Electronically Signed   By: Jasmine PangKim  Fujinaga M.D.   On: 05/22/2017 22:54    Procedures Procedures (including critical care time)  Medications Ordered in ED Medications  dexamethasone (DECADRON) tablet 12 mg (12 mg Oral Given 05/22/17 2304)  albuterol (PROVENTIL HFA;VENTOLIN HFA) 108 (90 Base) MCG/ACT inhaler 2 puff (2 puffs Inhalation Given 05/22/17 2300)  guaiFENesin-codeine 100-10 MG/5ML solution 10 mL (10 mLs Oral Given 05/22/17 2306)  ibuprofen (ADVIL,MOTRIN) tablet 600 mg (600 mg Oral Given 05/22/17 2304)  amoxicillin  (AMOXIL) capsule 1,000 mg (1,000 mg Oral Given 05/22/17 2306)  azithromycin (ZITHROMAX) tablet 500 mg (500 mg Oral Given 05/22/17 2306)     Initial Impression / Assessment and Plan / ED Course  I have reviewed the triage vital signs and the nursing notes.  Pertinent labs & imaging results that were available during my care of the patient were reviewed by me and considered in my medical decision making (see chart for details).     26 year old female with cough, dyspnea and pleuritic chest pain.  Imaging significant for left-sided pneumonia.  She has no increased work of breathing.  Oxygen saturations are adequate on room air.  I feel she is appropriate for outpatient  treatment.  Final Clinical Impressions(s) / ED Diagnoses   Final diagnoses:  Community acquired pneumonia of left lung, unspecified part of lung    ED Discharge Orders        Ordered    azithromycin (ZITHROMAX) 250 MG tablet  Daily     05/22/17 2302    amoxicillin (AMOXIL) 500 MG capsule  3 times daily     05/22/17 2302       Raeford RazorKohut, Willamina Grieshop, MD 06/03/17 [phone]

## 2017-09-30 ENCOUNTER — Other Ambulatory Visit (HOSPITAL_COMMUNITY): Payer: Self-pay | Admitting: Psychiatry

## 2017-09-30 DIAGNOSIS — F3181 Bipolar II disorder: Secondary | ICD-10-CM

## 2018-03-16 ENCOUNTER — Other Ambulatory Visit (HOSPITAL_COMMUNITY): Payer: Self-pay | Admitting: Psychiatry

## 2018-03-16 DIAGNOSIS — F3181 Bipolar II disorder: Secondary | ICD-10-CM

## 2018-08-06 ENCOUNTER — Ambulatory Visit (HOSPITAL_COMMUNITY): Payer: Self-pay | Admitting: Hematology

## 2019-02-04 IMAGING — DX DG CHEST 2V
2 series · 2 of 2 positions shown · non-contrast
Comparison: 05/11/2015

CLINICAL DATA: Short of breath

EXAM:
CHEST  2 VIEW

[chest pa]
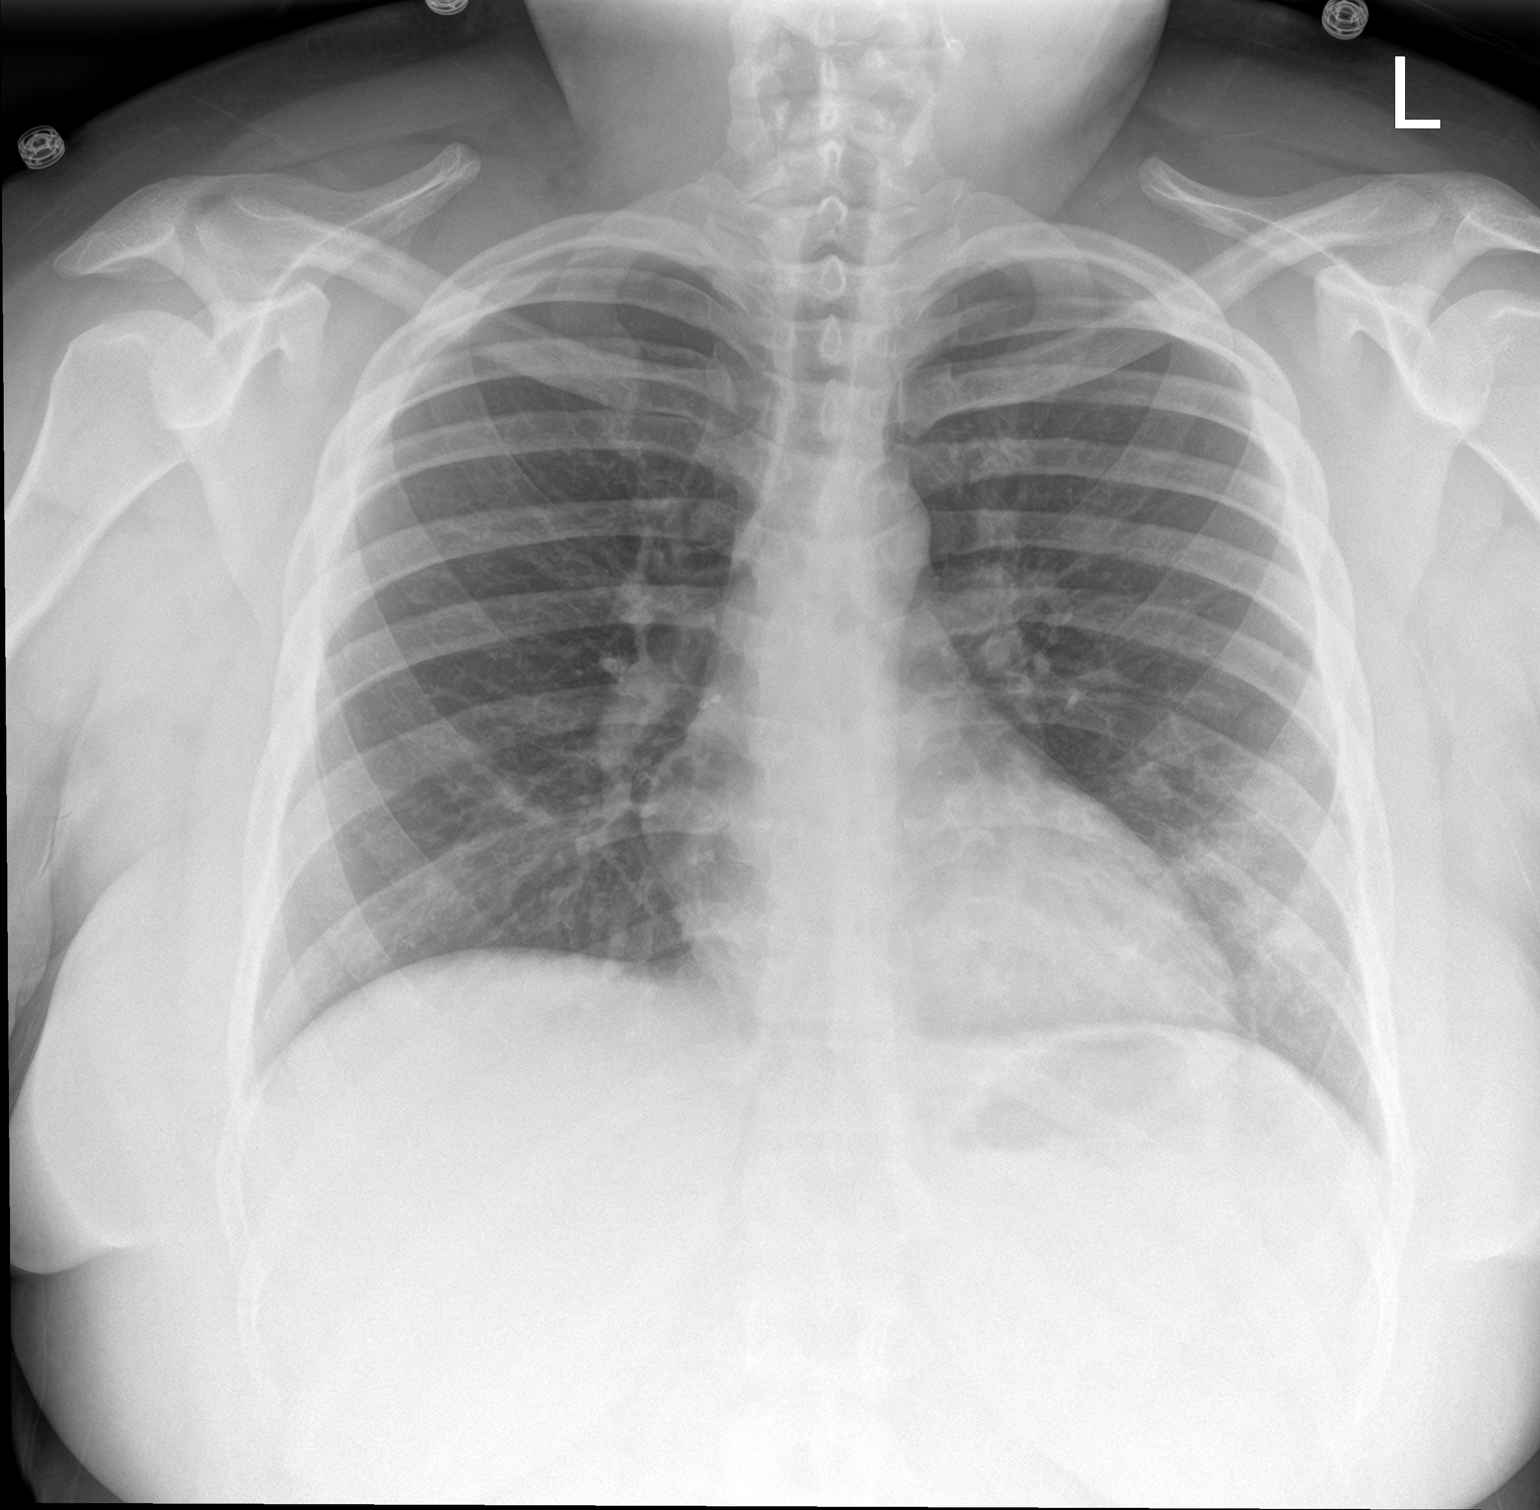

[chest lat]
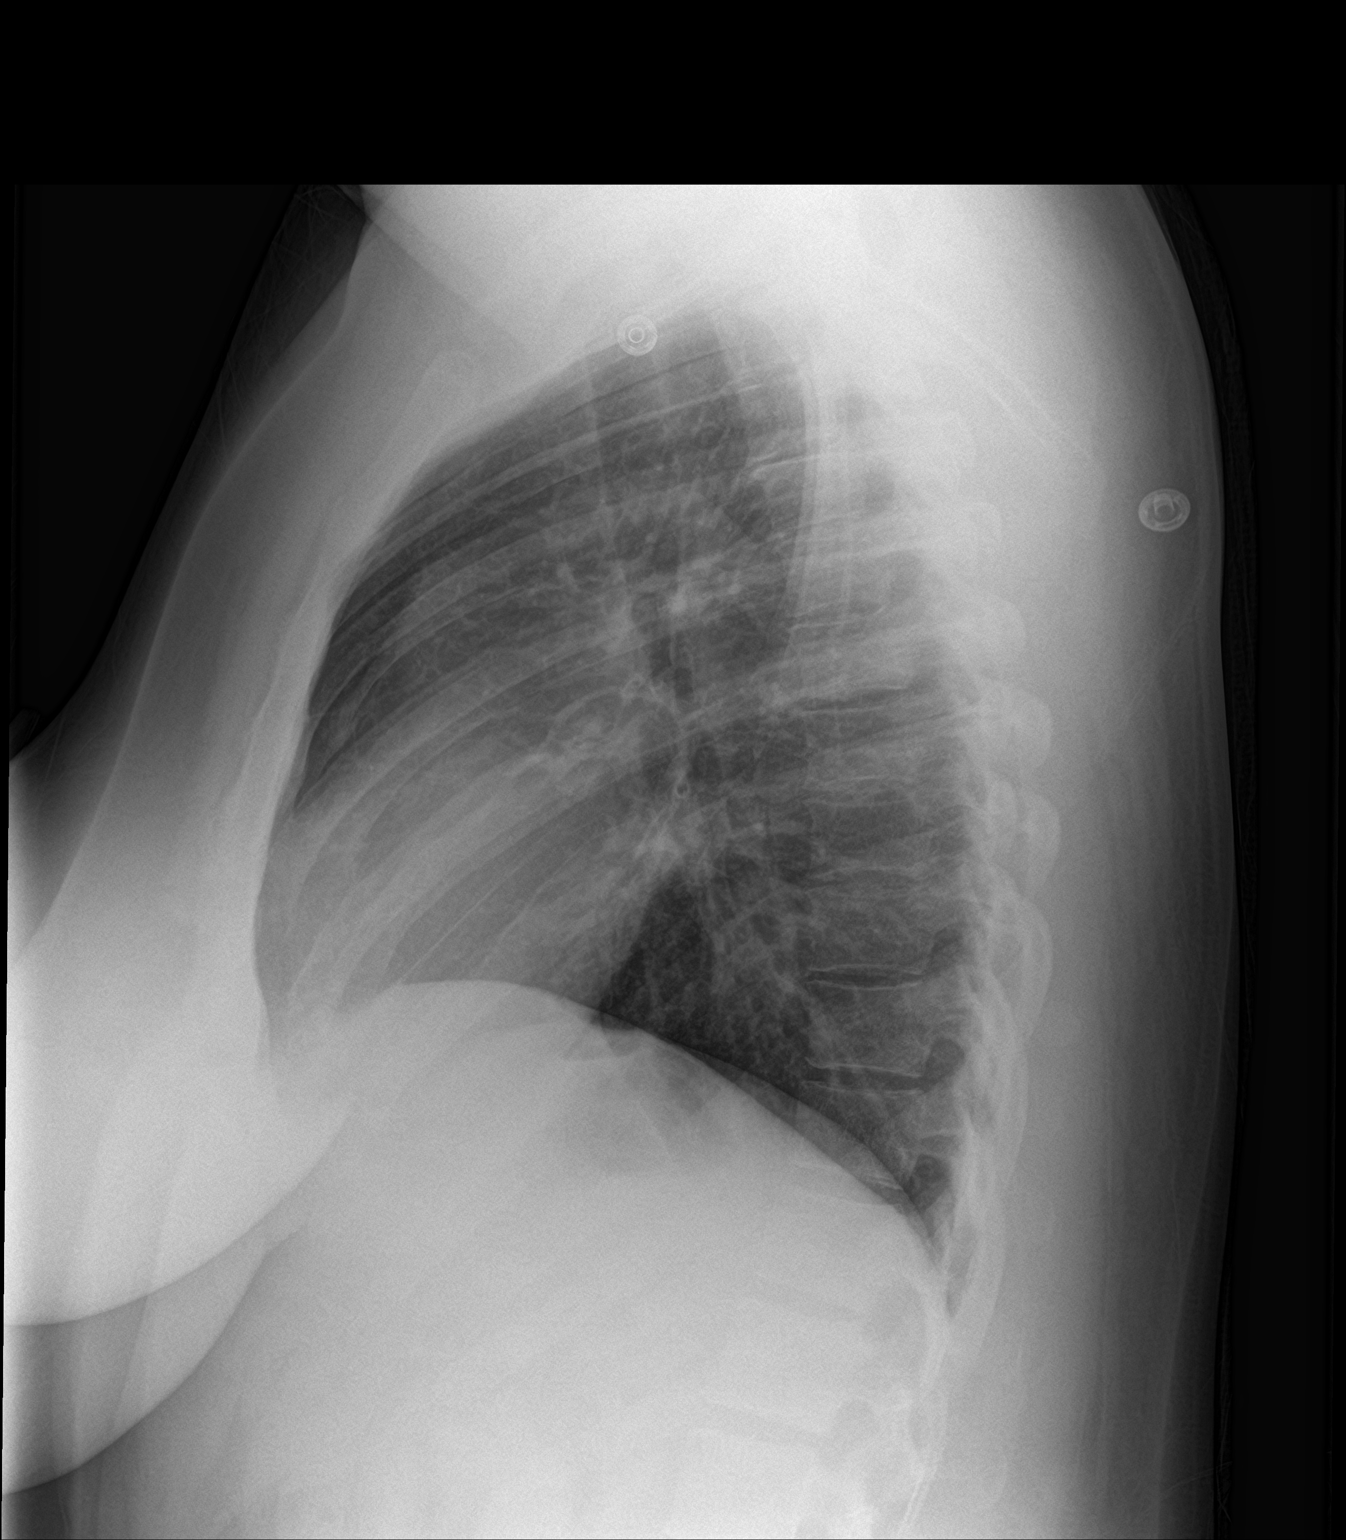

[2 of 2 positions shown; findings below may reference images not displayed]

FINDINGS: Patchy infiltrate at the left lung base. No pleural effusion. Normal
heart size. No pneumothorax.
IMPRESSION: Patchy left lower lung infiltrate

## 2019-08-17 ENCOUNTER — Ambulatory Visit: Payer: BC Managed Care – PPO | Attending: Internal Medicine

## 2019-08-17 ENCOUNTER — Other Ambulatory Visit: Payer: Self-pay

## 2019-08-17 DIAGNOSIS — Z20822 Contact with and (suspected) exposure to covid-19: Secondary | ICD-10-CM

## 2019-08-18 LAB — NOVEL CORONAVIRUS, NAA: SARS-CoV-2, NAA: NOT DETECTED

## 2022-08-06 ENCOUNTER — Ambulatory Visit
Admission: RE | Admit: 2022-08-06 | Discharge: 2022-08-06 | Disposition: A | Discharge status: Home / Self Care | Payer: BLUE CROSS/BLUE SHIELD | Source: Ambulatory Visit | Attending: Urgent Care | Admitting: Urgent Care

## 2022-08-06 ENCOUNTER — Ambulatory Visit (INDEPENDENT_AMBULATORY_CARE_PROVIDER_SITE_OTHER): Payer: BLUE CROSS/BLUE SHIELD

## 2022-08-06 VITALS — BP 153/107 | HR 89 | Temp 99.0°F | Resp 20

## 2022-08-06 DIAGNOSIS — F17200 Nicotine dependence, unspecified, uncomplicated: Secondary | ICD-10-CM | POA: Diagnosis not present

## 2022-08-06 DIAGNOSIS — R0689 Other abnormalities of breathing: Secondary | ICD-10-CM | POA: Diagnosis not present

## 2022-08-06 DIAGNOSIS — R059 Cough, unspecified: Secondary | ICD-10-CM | POA: Diagnosis not present

## 2022-08-06 DIAGNOSIS — J029 Acute pharyngitis, unspecified: Secondary | ICD-10-CM | POA: Diagnosis not present

## 2022-08-06 DIAGNOSIS — J209 Acute bronchitis, unspecified: Secondary | ICD-10-CM

## 2022-08-06 DIAGNOSIS — I10 Essential (primary) hypertension: Secondary | ICD-10-CM | POA: Diagnosis not present

## 2022-08-06 LAB — POCT RAPID STREP A (OFFICE): Rapid Strep A Screen: NEGATIVE

## 2022-08-06 MED ORDER — PROMETHAZINE-DM 6.25-15 MG/5ML PO SYRP: 5.0000 mL | ORAL_SOLUTION | Freq: Three times a day (TID) | ORAL | 0 refills | Status: AC | PRN

## 2022-08-06 MED ORDER — CETIRIZINE HCL 10 MG PO TABS: 10.0000 mg | ORAL_TABLET | Freq: Every day | ORAL | 0 refills | Status: AC

## 2022-08-06 MED ORDER — PREDNISONE 20 MG PO TABS: ORAL_TABLET | ORAL | 0 refills | Status: AC

## 2022-08-06 MED ORDER — ALBUTEROL SULFATE HFA 108 (90 BASE) MCG/ACT IN AERS: 1.0000 | INHALATION_SPRAY | Freq: Four times a day (QID) | RESPIRATORY_TRACT | 0 refills | Status: AC | PRN

## 2022-08-06 MED ORDER — LISINOPRIL-HYDROCHLOROTHIAZIDE 10-12.5 MG PO TABS: 1.0000 | ORAL_TABLET | Freq: Every day | ORAL | 0 refills | Status: AC

## 2022-08-06 NOTE — ED Provider Notes (Signed)
[facility]  Note:  This document was prepared using Systems analyst and may include unintentional dictation errors.  MRN: [id number] DOB: 1990/07/22  Subjective:   Patricia Ortiz is a 32 y.o. female presenting for 5-day history of acute onset persistent worsening throat pain, productive cough, chest congestion, shortness of breath, chest tightness.  Patient is not taking any medications currently.  She is supposed to be on blood pressure medication.  Has not followed up with the PCP in years.  Smokes 1/4 PPD.    No chronic medications.  Allergies  Allergen Reactions   Cherry [Wild Cherry Syrup] Anaphylaxis   Coconut Flavor Anaphylaxis   Pineapple Shortness Of Breath and Swelling   Hydrocodone Itching    Past Medical History:  Diagnosis Date   Bipolar 1 disorder ([place])    Diabetes mellitus without complication ([place])    Hypertension    PCOS (polycystic ovarian syndrome)      Past Surgical History:  Procedure Laterality Date   OOPHORECTOMY      Family History  Problem Relation Age of Onset   Diabetes Mother    Hypertension Mother    Hyperlipidemia Mother    Bipolar disorder Mother    Diabetes Father    Hypertension Father    Hyperlipidemia Father    Heart disease Father    Polycystic ovary syndrome Sister    Diabetes Maternal Grandmother    Hypertension Maternal Grandmother    Diabetes Maternal Grandfather    Hypertension Maternal Grandfather    Diabetes Paternal Grandmother    Hypertension Paternal Grandmother    Heart disease Paternal Grandmother    Diabetes Paternal Grandfather    Hypertension Paternal Grandfather    Diabetes Sister    Cancer Other        ovarian    Social History   Tobacco Use   Smoking status: Every Day    Packs/day: 0.25    Years: 16.00    Total pack years: 4.00    Types: Cigarettes   Smokeless tobacco: Never   Tobacco comments:    smokes 5-6 cig daily  Vaping Use   Vaping Use:  Never used  Substance Use Topics   Alcohol use: Not Currently   Drug use: No    ROS   Objective:   Vitals: BP (!) 153/107 (BP Location: Left Arm) Comment: noncompliant HTN  Pulse 89   Temp 99 F (37.2 C) (Oral)   Resp 20   LMP 07/25/2022   SpO2 98%   BP Readings from Last 3 Encounters:  08/06/22 (!) 153/107  05/22/17 (!) 207/104  02/11/17 (!) 178/107   Physical Exam Constitutional:      General: She is not in acute distress.    Appearance: Normal appearance. She is well-developed. She is not ill-appearing, toxic-appearing or diaphoretic.  HENT:     Head: Normocephalic and atraumatic.     Nose: Nose normal.     Mouth/Throat:     Mouth: Mucous membranes are moist.     Pharynx: Posterior oropharyngeal erythema present. No pharyngeal swelling, oropharyngeal exudate or uvula swelling.     Tonsils: No tonsillar exudate or tonsillar abscesses. 0 on the right. 0 on the left.  Eyes:     General: No scleral icterus.       Right eye: No discharge.        Left eye: No discharge.     Extraocular Movements: Extraocular movements intact.  Cardiovascular:     Rate and  Rhythm: Normal rate and regular rhythm.     Heart sounds: Normal heart sounds. No murmur heard.    No friction rub. No gallop.  Pulmonary:     Effort: Pulmonary effort is normal. No respiratory distress.     Breath sounds: No stridor. Wheezing and rhonchi present. No rales.  Chest:     Chest wall: No tenderness.  Skin:    General: Skin is warm and dry.  Neurological:     General: No focal deficit present.     Mental Status: She is alert and oriented to person, place, and time.  Psychiatric:        Mood and Affect: Mood normal.        Behavior: Behavior normal.    DG Chest 2 View  Result Date: 08/06/2022 CLINICAL DATA:  Cough, rhonchi EXAM: CHEST - 2 VIEW COMPARISON:  Chest radiograph 05/22/2017 FINDINGS: The cardiomediastinal contours are within normal limits. The lungs are clear. No pneumothorax or pleural  effusion. No acute finding in the visualized skeleton. IMPRESSION: No active cardiopulmonary disease. Electronically Signed   By: Audie Pinto M.D.   On: 08/06/2022 10:08     Results for orders placed or performed during the hospital encounter of 08/06/22 (from the past 24 hour(s))  POCT rapid strep A     Status: None   Collection Time: 08/06/22 10:04 AM  Result Value Ref Range   Rapid Strep A Screen Negative Negative    Assessment and Plan :   PDMP not reviewed this encounter.  1. Acute bronchitis, unspecified organism   2. Essential hypertension   3. Smoker     Will manage for bronchitis with albuterol, prednisone and supportive care.  Deferred COVID testing given timeline of her illness.  Recommended restarting blood pressure medication especially with her use of prednisone.  Reviewed hypertensive friendly diet.  She is to establish care with a new PCP soon as possible. X-ray over-read was pending at time of discharge, recommended follow up with only abnormal results. Otherwise will not call for negative over-read. Patient was in agreement.  Rapid strep test negative.  Will defer throat culture. Counseled patient on potential for adverse effects with medications prescribed/recommended today, ER and return-to-clinic precautions discussed, patient verbalized understanding.    Jaynee Eagles, PA-C 08/06/22 1031

## 2022-08-06 NOTE — Discharge Instructions (Signed)
I will let you know about your x-ray results by phone.  Will update your diagnosis if necessary once I get that report.  For now I am managing your illness for a viral respiratory infection that has caused acute bronchitis.  Make sure that you hold off on smoking and take this opportunity to quit altogether.  Start taking Zyrtec every day as long as you are smoking.  For the bronchitis you can use albuterol and prednisone, use cough medication as needed.  Restart your blood pressure medication lisinopril hydrochlorothiazide 1 tablet daily.  Make sure you establish care with one of the new primary care providers as listed for you.  For diabetes or elevated blood sugar, please make sure you are limiting and avoiding starchy, carbohydrate foods like pasta, breads, sweet breads, pastry, rice, potatoes, desserts. These foods can elevate your blood sugar. Also, limit and avoid drinks that contain a lot of sugar such as sodas, sweet teas, fruit juices.  Drinking plain water will be much more helpful, try 64 ounces of water daily.  It is okay to flavor your water naturally by cutting cucumber, lemon, mint or lime, placing it in a picture with water and drinking it over a period of 24-48 hours as long as it remains refrigerated.  For elevated blood pressure, make sure you are monitoring salt in your diet.  Do not eat restaurant foods and limit processed foods at home. I highly recommend you prepare and cook your own foods at home.  Processed foods include things like frozen meals, pre-seasoned meats and dinners, deli meats, canned foods as these foods contain a high amount of sodium/salt.  Make sure you are paying attention to sodium labels on foods you buy at the grocery store. Buy your spices separately such as garlic powder, onion powder, cumin, cayenne, parsley flakes so that you can avoid seasonings that contain salt. However, salt-free seasonings are available and can be used, an example is Mrs. Dash and includes  a lot of different mixtures that do not contain salt.  Lastly, when cooking using oils that are healthier for you is important. This includes olive oil, avocado oil, canola oil. We have discussed a lot of foods to avoid but below is a list of foods that can be very healthy to use in your diet whether it is for diabetes, cholesterol, high blood pressure, or in general healthy eating.  Salads - kale, spinach, cabbage, spring mix, arugula Fruits - avocadoes, berries (blueberries, raspberries, blackberries), apples, oranges, pomegranate, grapefruit, kiwi Vegetables - asparagus, cauliflower, broccoli, green beans, brussel sprouts, bell peppers, beets; stay away from or limit starchy vegetables like potatoes, carrots, peas Other general foods - kidney beans, egg whites, almonds, walnuts, sunflower seeds, pumpkin seeds, fat free yogurt, almond milk, flax seeds, quinoa, oats  Meat - It is better to eat lean meats and limit your red meat including pork to once a week.  Wild caught fish, chicken breast are good options as they tend to be leaner sources of good protein. Still be mindful of the sodium labels for the meats you buy.  DO NOT EAT ANY FOODS ON THIS LIST THAT YOU ARE ALLERGIC TO. For more specific needs, I highly recommend consulting a dietician or nutritionist but this can definitely be a good starting point.

## 2022-08-06 NOTE — ED Triage Notes (Signed)
Pt c/o sore throat, prod cough, chest congestion x 5 days-NAD-steady gait

## 2024-05-09 ENCOUNTER — Encounter (HOSPITAL_COMMUNITY): Payer: Self-pay

## 2024-05-09 ENCOUNTER — Emergency Department (HOSPITAL_COMMUNITY): Admission: EM | Admit: 2024-05-09 | Discharge: 2024-05-09 | Disposition: A | Discharge status: Home / Self Care

## 2024-05-09 DIAGNOSIS — R519 Headache, unspecified: Secondary | ICD-10-CM | POA: Diagnosis not present

## 2024-05-09 DIAGNOSIS — Z79899 Other long term (current) drug therapy: Secondary | ICD-10-CM | POA: Insufficient documentation

## 2024-05-09 DIAGNOSIS — Y92481 Parking lot as the place of occurrence of the external cause: Secondary | ICD-10-CM | POA: Insufficient documentation

## 2024-05-09 DIAGNOSIS — T148XXA Other injury of unspecified body region, initial encounter: Secondary | ICD-10-CM | POA: Diagnosis present

## 2024-05-09 MED ORDER — CYCLOBENZAPRINE HCL 10 MG PO TABS: 10.0000 mg | ORAL_TABLET | Freq: Two times a day (BID) | ORAL | 0 refills | Status: DC | PRN

## 2024-05-09 MED ORDER — CYCLOBENZAPRINE HCL 10 MG PO TABS: 10.0000 mg | ORAL_TABLET | Freq: Two times a day (BID) | ORAL | 0 refills | Status: AC | PRN

## 2024-05-09 MED ORDER — IBUPROFEN 600 MG PO TABS: 600.0000 mg | ORAL_TABLET | Freq: Four times a day (QID) | ORAL | 0 refills | Status: DC | PRN

## 2024-05-09 MED ORDER — IBUPROFEN 600 MG PO TABS: 600.0000 mg | ORAL_TABLET | Freq: Four times a day (QID) | ORAL | 0 refills | Status: AC | PRN
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
## 2024-05-09 NOTE — Discharge Instructions (Signed)
 Your pain is likely due to minor soft tissue injury such as strains or sprain.  Please take medication prescribed for symptom control.  You may follow-up with orthopedic specialist as needed.
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
## 2024-05-09 NOTE — ED Notes (Signed)
 Pt completely assessed by EDP and set to discharge. No RN assessment performed.   Pt teaching provided on medications that may cause drowsiness. Pt instructed not to drive or operate heavy machinery while taking the prescribed medication. Pt verbalized understanding.   Pt provided discharge instructions and prescription information. Pt was given the opportunity to ask questions and questions were answered.

## 2024-07-07 ENCOUNTER — Emergency Department (HOSPITAL_BASED_OUTPATIENT_CLINIC_OR_DEPARTMENT_OTHER)

## 2024-07-07 ENCOUNTER — Other Ambulatory Visit: Payer: Self-pay

## 2024-07-07 ENCOUNTER — Emergency Department (HOSPITAL_BASED_OUTPATIENT_CLINIC_OR_DEPARTMENT_OTHER)
Admission: EM | Admit: 2024-07-07 | Discharge: 2024-07-07 | Disposition: A | Discharge status: Home / Self Care | Attending: Emergency Medicine | Admitting: Emergency Medicine

## 2024-07-07 ENCOUNTER — Encounter (HOSPITAL_BASED_OUTPATIENT_CLINIC_OR_DEPARTMENT_OTHER): Payer: Self-pay

## 2024-07-07 DIAGNOSIS — R1013 Epigastric pain: Secondary | ICD-10-CM | POA: Diagnosis not present

## 2024-07-07 DIAGNOSIS — R109 Unspecified abdominal pain: Secondary | ICD-10-CM

## 2024-07-07 DIAGNOSIS — Z91040 Latex allergy status: Secondary | ICD-10-CM | POA: Insufficient documentation

## 2024-07-07 DIAGNOSIS — Z79899 Other long term (current) drug therapy: Secondary | ICD-10-CM | POA: Insufficient documentation

## 2024-07-07 DIAGNOSIS — R197 Diarrhea, unspecified: Secondary | ICD-10-CM | POA: Insufficient documentation

## 2024-07-07 DIAGNOSIS — E119 Type 2 diabetes mellitus without complications: Secondary | ICD-10-CM | POA: Diagnosis not present

## 2024-07-07 DIAGNOSIS — Z7984 Long term (current) use of oral hypoglycemic drugs: Secondary | ICD-10-CM | POA: Diagnosis not present

## 2024-07-07 DIAGNOSIS — I10 Essential (primary) hypertension: Secondary | ICD-10-CM | POA: Insufficient documentation

## 2024-07-07 DIAGNOSIS — R112 Nausea with vomiting, unspecified: Secondary | ICD-10-CM | POA: Insufficient documentation

## 2024-07-07 LAB — COMPREHENSIVE METABOLIC PANEL WITH GFR
ALT: 12 U/L (ref 0–44)
AST: 14 U/L — ABNORMAL LOW (ref 15–41)
Albumin: 4.1 g/dL (ref 3.5–5.0)
Alkaline Phosphatase: 97 U/L (ref 38–126)
Anion gap: 12 (ref 5–15)
BUN: 14 mg/dL (ref 6–20)
CO2: 27 mmol/L (ref 22–32)
Calcium: 10.1 mg/dL (ref 8.9–10.3)
Chloride: 98 mmol/L (ref 98–111)
Creatinine, Ser: 0.89 mg/dL (ref 0.44–1.00)
GFR, Estimated: >60 mL/min
Glucose, Bld: 121 mg/dL — ABNORMAL HIGH (ref 70–99)
Potassium: 3.4 mmol/L — ABNORMAL LOW (ref 3.5–5.1)
Sodium: 137 mmol/L (ref 135–145)
Total Bilirubin: 0.2 mg/dL (ref 0.0–1.2)
Total Protein: 7.5 g/dL (ref 6.5–8.1)

## 2024-07-07 LAB — CBC WITH DIFFERENTIAL/PLATELET
Abs Immature Granulocytes: 0.05 K/uL (ref 0.00–0.07)
Basophils Absolute: 0 K/uL (ref 0.0–0.1)
Basophils Relative: 0 %
Eosinophils Absolute: 0.3 K/uL (ref 0.0–0.5)
Eosinophils Relative: 2 %
HCT: 36.6 % (ref 36.0–46.0)
Hemoglobin: 11.9 g/dL — ABNORMAL LOW (ref 12.0–15.0)
Immature Granulocytes: 0 %
Lymphocytes Relative: 13 %
Lymphs Abs: 1.7 K/uL (ref 0.7–4.0)
MCH: 25.8 pg — ABNORMAL LOW (ref 26.0–34.0)
MCHC: 32.5 g/dL (ref 30.0–36.0)
MCV: 79.4 fL — ABNORMAL LOW (ref 80.0–100.0)
Monocytes Absolute: 0.7 K/uL (ref 0.1–1.0)
Monocytes Relative: 5 %
Neutro Abs: 10.7 K/uL — ABNORMAL HIGH (ref 1.7–7.7)
Neutrophils Relative %: 80 %
Platelets: 398 K/uL (ref 150–400)
RBC: 4.61 MIL/uL (ref 3.87–5.11)
RDW: 15.5 % (ref 11.5–15.5)
WBC: 13.5 K/uL — ABNORMAL HIGH (ref 4.0–10.5)
nRBC: 0 % (ref 0.0–0.2)

## 2024-07-07 LAB — URINALYSIS, ROUTINE W REFLEX MICROSCOPIC
Bacteria, UA: NONE SEEN
Bilirubin Urine: NEGATIVE
Glucose, UA: 250 mg/dL — AB
Hgb urine dipstick: NEGATIVE
Ketones, ur: NEGATIVE mg/dL
Leukocytes,Ua: NEGATIVE
Nitrite: NEGATIVE
Protein, ur: 100 mg/dL — AB
Specific Gravity, Urine: 1.021 (ref 1.005–1.030)
pH: 6.5 (ref 5.0–8.0)

## 2024-07-07 LAB — PREGNANCY, URINE: Preg Test, Ur: NEGATIVE

## 2024-07-07 LAB — LIPASE, BLOOD: Lipase: 45 U/L (ref 11–51)

## 2024-07-07 MED ORDER — ONDANSETRON 8 MG PO TBDP: 8.0000 mg | ORAL_TABLET | Freq: Three times a day (TID) | ORAL | 0 refills | Status: AC | PRN

## 2024-07-07 MED ORDER — LACTATED RINGERS IV BOLUS
1000.0000 mL | Freq: Once | INTRAVENOUS | Status: AC
  Administered 2024-07-07: 1000 mL via INTRAVENOUS

## 2024-07-07 MED ORDER — ONDANSETRON HCL 4 MG/2ML IJ SOLN
4.0000 mg | Freq: Once | INTRAMUSCULAR | Status: AC
  Administered 2024-07-07: 4 mg via INTRAVENOUS
  Filled 2024-07-07: qty 2

## 2024-07-07 MED ORDER — IOHEXOL 300 MG/ML  SOLN
100.0000 mL | Freq: Once | INTRAMUSCULAR | Status: AC | PRN
  Administered 2024-07-07: 100 mL via INTRAVENOUS

## 2024-07-07 MED ORDER — KETOROLAC TROMETHAMINE 15 MG/ML IJ SOLN
15.0000 mg | Freq: Once | INTRAMUSCULAR | Status: AC
  Administered 2024-07-07: 15 mg via INTRAVENOUS
  Filled 2024-07-07: qty 1

## 2024-07-07 NOTE — ED Provider Notes (Signed)
 " [place] EMERGENCY DEPARTMENT AT [facility] Provider Note   CSN: [id number] Arrival date & time: 07/07/24  9376     Patient presents with: Nausea, Diarrhea, and Flank Pain   Patricia Ortiz is a 33 y.o. female.    Diarrhea Flank Pain     Patient has a history of hypertension diabetes bipolar disorder polycystic ovarian syndrome.  Patient presents to the ED with complaints of abdominal pain.  Patient states that started this morning.  She has had several episodes of vomiting and diarrhea.  She is also having cramping in her abdomen that goes up towards her flanks.  She denies any dysuria.  No fevers.  No prior abdominal surgeries.  Patient states she has had several episodes of both vomiting and diarrhea.  Patient also reports having some chronic back issues.  She has been seeing a land.  She feels like the last treatment made it worse.  Prior to Admission medications  Medication Sig Start Date End Date Taking? Authorizing Provider  labetalol (NORMODYNE) 200 MG tablet Take 200 mg by mouth 2 (two) times daily.   Yes [provider]  NIFEdipine (ADALAT CC) 90 MG 24 hr tablet Take 90 mg by mouth daily.   Yes [provider]  ondansetron  (ZOFRAN -ODT) 8 MG disintegrating tablet Take 1 tablet (8 mg total) by mouth every 8 (eight) hours as needed for nausea or vomiting. 07/07/24  Yes Randol Simmonds, MD  rosuvastatin (CRESTOR) 5 MG tablet Take 5 mg by mouth daily.   Yes [provider]  Semaglutide, 1 MG/DOSE, (OZEMPIC, 1 MG/DOSE,) 4 MG/3ML SOPN Inject into the skin.   Yes [provider]  albuterol  (VENTOLIN  HFA) 108 (90 Base) MCG/ACT inhaler Inhale 1-2 puffs into the lungs every 6 (six) hours as needed for wheezing or shortness of breath. 08/06/22   Christopher Savannah, PA-C  cetirizine  (ZYRTEC  ALLERGY) 10 MG tablet Take 1 tablet (10 mg total) by mouth daily. 08/06/22   Christopher Savannah, PA-C  cyclobenzaprine  (FLEXERIL ) 10 MG tablet Take 1 tablet (10  mg total) by mouth 2 (two) times daily as needed for muscle spasms. 05/09/24   Nivia Colon, PA-C  ibuprofen  (ADVIL ) 600 MG tablet Take 1 tablet (600 mg total) by mouth every 6 (six) hours as needed. 05/09/24   Nivia Colon, PA-C  lamoTRIgine  (LAMICTAL ) 25 MG tablet Take 25 mg daily for 1 week, then increase to 50 mg daily for 1 week.  After 2 weeks, increase to 100 mg daily 03/06/17   Leila Marsa Jasper, MD  lisinopril -hydrochlorothiazide  (ZESTORETIC ) 10-12.5 MG tablet Take 1 tablet by mouth daily. 08/06/22   Christopher Savannah, PA-C  lovastatin (MEVACOR) 10 MG tablet Take 10 mg by mouth at bedtime.  09/12/15   [provider]  predniSONE  (DELTASONE ) 20 MG tablet Take 2 tablets daily with breakfast. 08/06/22   Christopher Savannah, PA-C  promethazine -dextromethorphan (PROMETHAZINE -DM) 6.25-15 MG/5ML syrup Take 5 mLs by mouth 3 (three) times daily as needed for cough. 08/06/22   Christopher Savannah, PA-C  traZODone  (DESYREL ) 100 MG tablet TAKE 2 TABLETS BY MOUTH AT BEDTIME 09/30/17   Eksir, Marsa Jasper, MD    Allergies: Connell klein cherry syrup], Coconut flavoring agent (non-screening), Pineapple, Latex, and Hydrocodone     Review of Systems  Gastrointestinal:  Positive for diarrhea.  Genitourinary:  Positive for flank pain.    Updated Vital Signs BP (!) 184/106   Pulse 85   Temp 98.3 F (36.8 C) (Oral)   Resp 17   Ht 1.676  m (5' 6)   Wt 105.7 kg   LMP 06/26/2024   SpO2 99%   BMI 37.61 kg/m   Physical Exam Vitals and nursing note reviewed.  Constitutional:      General: She is not in acute distress.    Appearance: She is well-developed.  HENT:     Head: Normocephalic and atraumatic.     Right Ear: External ear normal.     Left Ear: External ear normal.  Eyes:     General: No scleral icterus.       Right eye: No discharge.        Left eye: No discharge.     Conjunctiva/sclera: Conjunctivae normal.  Neck:     Trachea: No tracheal deviation.  Cardiovascular:     Rate and Rhythm: Normal  rate and regular rhythm.  Pulmonary:     Effort: Pulmonary effort is normal. No respiratory distress.     Breath sounds: Normal breath sounds. No stridor. No wheezing or rales.  Abdominal:     General: Bowel sounds are normal. There is no distension.     Palpations: Abdomen is soft.     Tenderness: There is abdominal tenderness in the epigastric area. There is no guarding or rebound.  Musculoskeletal:        General: No tenderness or deformity.     Cervical back: Neck supple.  Skin:    General: Skin is warm and dry.     Findings: No rash.  Neurological:     General: No focal deficit present.     Mental Status: She is alert.     Cranial Nerves: No cranial nerve deficit, dysarthria or facial asymmetry.     Sensory: No sensory deficit.     Motor: No abnormal muscle tone or seizure activity.     Coordination: Coordination normal.  Psychiatric:        Mood and Affect: Mood normal.     (all labs ordered are listed, but only abnormal results are displayed) Labs Reviewed  COMPREHENSIVE METABOLIC PANEL WITH GFR - Abnormal; Notable for the following components:      Result Value   Potassium 3.4 (*)    Glucose, Bld 121 (*)    AST 14 (*)    All other components within normal limits  CBC WITH DIFFERENTIAL/PLATELET - Abnormal; Notable for the following components:   WBC 13.5 (*)    Hemoglobin 11.9 (*)    MCV 79.4 (*)    MCH 25.8 (*)    Neutro Abs 10.7 (*)    All other components within normal limits  URINALYSIS, ROUTINE W REFLEX MICROSCOPIC - Abnormal; Notable for the following components:   APPearance HAZY (*)    Glucose, UA 250 (*)    Protein, ur 100 (*)    All other components within normal limits  LIPASE, BLOOD  PREGNANCY, URINE    EKG: None  Radiology: CT ABDOMEN PELVIS W CONTRAST Result Date: 07/07/2024 CLINICAL DATA:  Nonlocalized abdominal pain. EXAM: CT ABDOMEN AND PELVIS WITH CONTRAST TECHNIQUE: Multidetector CT imaging of the abdomen and pelvis was performed using  the standard protocol following bolus administration of intravenous contrast. RADIATION DOSE REDUCTION: This exam was performed according to the departmental dose-optimization program which includes automated exposure control, adjustment of the mA and/or kV according to patient size and/or use of iterative reconstruction technique. CONTRAST:  OMNIPAQUE  IOHEXOL  300 MG/ML  SOLN COMPARISON:  None Available. FINDINGS: Lower chest: No acute findings. Hepatobiliary: No suspicious focal abnormality within the liver  parenchyma. There is no evidence for gallstones, gallbladder wall thickening, or pericholecystic fluid. No intrahepatic or extrahepatic biliary dilation. Pancreas: No focal mass lesion. No dilatation of the main duct. No intraparenchymal cyst. No peripancreatic edema. Spleen: No splenomegaly. No suspicious focal mass lesion. Adrenals/Urinary Tract: No adrenal nodule or mass. Kidneys unremarkable. No evidence for hydroureter. The urinary bladder appears normal for the degree of distention. Stomach/Bowel: Stomach is unremarkable. No gastric wall thickening. No evidence of outlet obstruction. Duodenum is normally positioned as is the ligament of Treitz. No small bowel wall thickening. No small bowel dilatation. The terminal ileum is normal. The appendix is normal. No gross colonic mass. No colonic wall thickening. Vascular/Lymphatic: No abdominal aortic aneurysm. No abdominal aortic atherosclerotic calcification. Portal vein and superior mesenteric vein are patent. There is no gastrohepatic or hepatoduodenal ligament lymphadenopathy. No retroperitoneal or mesenteric lymphadenopathy. No pelvic sidewall lymphadenopathy. Reproductive: The uterus is unremarkable.  There is no adnexal mass. Other: No intraperitoneal free fluid. Musculoskeletal: No worrisome lytic or sclerotic osseous abnormality. IMPRESSION: No acute findings in the abdomen or pelvis. Specifically, no findings to explain the patient's history of  abdominal pain. Electronically Signed   By: Camellia Candle M.D.   On: 07/07/2024 09:30     Procedures   Medications Ordered in the ED  lactated ringers  bolus 1,000 mL (0 mLs Intravenous Stopped 07/07/24 0851)  ondansetron  (ZOFRAN ) injection 4 mg (4 mg Intravenous Given 07/07/24 0739)  ketorolac  (TORADOL ) 15 MG/ML injection 15 mg (15 mg Intravenous Given 07/07/24 0739)  iohexol  (OMNIPAQUE ) 300 MG/ML solution 100 mL (100 mLs Intravenous Contrast Given 07/07/24 0900)    Clinical Course as of 07/07/24 0952  Thu Jul 07, 2024  0817 CBC with Diff(!) CBC shows leukocytosis.  Metabolic panel normal.  Lipase normal [JK]  0939 Urinalysis, Routine w reflex microscopic -Urine, Clean Catch(!) Urinalysis normal. [JK]  0939 CT scan without acute finding [JK]    Clinical Course User Index [JK] Randol Simmonds, MD                                 Medical Decision Making Problems Addressed: Abdominal pain, unspecified abdominal location: acute illness or injury that poses a threat to life or bodily functions Nausea and vomiting, unspecified vomiting type: acute illness or injury  Amount and/or Complexity of Data Reviewed Labs: ordered. Decision-making details documented in ED Course. Radiology: ordered and independent interpretation performed.  Risk Prescription drug management.   Patient presented to the ED for ration of nausea abdominal pain.  ED workup without signs of hepatitis or pancreatitis.  No signs of urinary tract infection.  With her abdominal discomfort and elevated white blood cell count CT scan was performed.  CT does not show any acute abnormality.  Incidental fibroid uterus noted but I do not think that is related to her symptoms.  Patient noted to be hypertensive.  She does have history of same.  Doubt that is related to her symptoms today.  Will discharge home with supportive care.  Recommend outpatient follow-up with PCP to be rechecked and to follow-up on her blood  pressure.     Final diagnoses:  Abdominal pain, unspecified abdominal location  Nausea and vomiting, unspecified vomiting type  Hypertension, unspecified type    ED Discharge Orders          Ordered    ondansetron  (ZOFRAN -ODT) 8 MG disintegrating tablet  Every 8 hours PRN  07/07/24 9049               Randol Simmonds, MD 07/07/24 [phone]  "

## 2024-07-07 NOTE — ED Triage Notes (Signed)
 POV Bila flank pain, +N/+V/+D, that started today at 4:30am.

## 2024-07-07 NOTE — Discharge Instructions (Addendum)
 The CT scan did show signs any acute abnormality.  You do have fibroids but I do not think that is the source of your symptoms  Take over-the-counter medications to help with stomach pain and cramping.  Take the Zofran  to help with nausea and vomiting.  Your blood pressure was also elevated today.  Follow-up with your primary care doctor to have that rechecked.
